# Patient Record
Sex: Female | Born: 1980 | Race: Black or African American | Hispanic: No | Marital: Married | Smoking: Former smoker
Health system: Southern US, Community
[De-identification: ages and names within clinical notes are randomized; demographics above are authoritative.]

## PROBLEM LIST (undated history)

## (undated) DIAGNOSIS — R2242 Localized swelling, mass and lump, left lower limb: Secondary | ICD-10-CM

---

## 2001-10-23 ENCOUNTER — Emergency Department (HOSPITAL_COMMUNITY): Admission: EM | Admit: 2001-10-23 | Discharge: 2001-10-24 | Payer: Self-pay | Admitting: Emergency Medicine

## 2001-10-26 ENCOUNTER — Ambulatory Visit (HOSPITAL_COMMUNITY): Admission: RE | Admit: 2001-10-26 | Discharge: 2001-10-26 | Payer: Self-pay | Admitting: Emergency Medicine

## 2001-10-26 ENCOUNTER — Encounter: Payer: Self-pay | Admitting: Emergency Medicine

## 2002-02-03 ENCOUNTER — Ambulatory Visit (HOSPITAL_COMMUNITY): Admission: RE | Admit: 2002-02-03 | Discharge: 2002-02-03 | Payer: Self-pay | Admitting: *Deleted

## 2002-02-03 ENCOUNTER — Encounter: Payer: Self-pay | Admitting: *Deleted

## 2002-02-09 ENCOUNTER — Emergency Department (HOSPITAL_COMMUNITY): Admission: EM | Admit: 2002-02-09 | Discharge: 2002-02-09 | Payer: Self-pay | Admitting: Emergency Medicine

## 2002-12-05 ENCOUNTER — Ambulatory Visit (HOSPITAL_COMMUNITY): Admission: RE | Admit: 2002-12-05 | Discharge: 2002-12-05 | Payer: Self-pay | Admitting: Gastroenterology

## 2003-01-16 ENCOUNTER — Emergency Department (HOSPITAL_COMMUNITY): Admission: EM | Admit: 2003-01-16 | Discharge: 2003-01-16 | Payer: Self-pay | Admitting: Emergency Medicine

## 2003-02-03 ENCOUNTER — Encounter (INDEPENDENT_AMBULATORY_CARE_PROVIDER_SITE_OTHER): Payer: Self-pay | Admitting: Specialist

## 2003-02-03 ENCOUNTER — Ambulatory Visit (HOSPITAL_COMMUNITY): Admission: RE | Admit: 2003-02-03 | Discharge: 2003-02-04 | Payer: Self-pay | Admitting: General Surgery

## 2003-02-03 HISTORY — PX: CHOLECYSTECTOMY: SHX55

## 2006-09-17 ENCOUNTER — Emergency Department (HOSPITAL_COMMUNITY): Admission: EM | Admit: 2006-09-17 | Discharge: 2006-09-17 | Payer: Self-pay | Admitting: Family Medicine

## 2007-10-05 ENCOUNTER — Emergency Department (HOSPITAL_COMMUNITY): Admission: EM | Admit: 2007-10-05 | Discharge: 2007-10-05 | Payer: Self-pay | Admitting: Emergency Medicine

## 2008-05-19 ENCOUNTER — Emergency Department (HOSPITAL_COMMUNITY): Admission: EM | Admit: 2008-05-19 | Discharge: 2008-05-19 | Payer: Self-pay | Admitting: Emergency Medicine

## 2008-08-07 ENCOUNTER — Ambulatory Visit (HOSPITAL_COMMUNITY): Admission: RE | Admit: 2008-08-07 | Discharge: 2008-08-07 | Payer: Self-pay | Admitting: Family Medicine

## 2009-01-30 ENCOUNTER — Emergency Department (HOSPITAL_COMMUNITY): Admission: EM | Admit: 2009-01-30 | Discharge: 2009-01-30 | Payer: Self-pay | Admitting: Family Medicine

## 2009-09-05 ENCOUNTER — Inpatient Hospital Stay (HOSPITAL_COMMUNITY): Admission: AD | Admit: 2009-09-05 | Discharge: 2009-09-05 | Payer: Self-pay | Admitting: Obstetrics and Gynecology

## 2009-09-26 ENCOUNTER — Inpatient Hospital Stay (HOSPITAL_COMMUNITY): Admission: AD | Admit: 2009-09-26 | Discharge: 2009-09-26 | Payer: Self-pay | Admitting: Obstetrics and Gynecology

## 2009-09-27 ENCOUNTER — Inpatient Hospital Stay (HOSPITAL_COMMUNITY): Admission: AD | Admit: 2009-09-27 | Discharge: 2009-09-27 | Payer: Self-pay | Admitting: Obstetrics and Gynecology

## 2009-09-27 ENCOUNTER — Other Ambulatory Visit: Payer: Self-pay | Admitting: Obstetrics and Gynecology

## 2009-09-28 ENCOUNTER — Inpatient Hospital Stay (HOSPITAL_COMMUNITY): Admission: AD | Admit: 2009-09-28 | Discharge: 2009-10-01 | Payer: Self-pay | Admitting: Obstetrics and Gynecology

## 2010-03-28 ENCOUNTER — Encounter: Payer: Self-pay | Admitting: Family Medicine

## 2010-05-21 LAB — CBC
HCT: 25.3 % — ABNORMAL LOW (ref 36.0–46.0)
Hemoglobin: 10.4 g/dL — ABNORMAL LOW (ref 12.0–15.0)
Hemoglobin: 8.8 g/dL — ABNORMAL LOW (ref 12.0–15.0)
MCH: 30.4 pg (ref 26.0–34.0)
MCH: 30.7 pg (ref 26.0–34.0)
MCHC: 34.6 g/dL (ref 30.0–36.0)
MCHC: 34.8 g/dL (ref 30.0–36.0)
MCV: 87.3 fL (ref 78.0–100.0)
RBC: 3.44 MIL/uL — ABNORMAL LOW (ref 3.87–5.11)
RDW: 13 % (ref 11.5–15.5)

## 2010-05-21 LAB — BASIC METABOLIC PANEL
CO2: 22 mEq/L (ref 19–32)
Chloride: 104 mEq/L (ref 96–112)
Creatinine, Ser: 0.46 mg/dL (ref 0.4–1.2)
GFR calc Af Amer: >60 mL/min (ref >60)
Glucose, Bld: 82 mg/dL (ref 70–99)
Sodium: 133 mEq/L — ABNORMAL LOW (ref 135–145)

## 2010-06-08 ENCOUNTER — Emergency Department (HOSPITAL_COMMUNITY)
Admission: EM | Admit: 2010-06-08 | Discharge: 2010-06-08 | Disposition: A | Discharge status: Home / Self Care | Payer: Medicaid Other | Attending: Emergency Medicine | Admitting: Emergency Medicine

## 2010-06-08 ENCOUNTER — Emergency Department (HOSPITAL_COMMUNITY): Payer: Medicaid Other

## 2010-06-08 DIAGNOSIS — I10 Essential (primary) hypertension: Secondary | ICD-10-CM | POA: Insufficient documentation

## 2010-06-08 DIAGNOSIS — S61209A Unspecified open wound of unspecified finger without damage to nail, initial encounter: Secondary | ICD-10-CM | POA: Insufficient documentation

## 2010-06-08 DIAGNOSIS — W2209XA Striking against other stationary object, initial encounter: Secondary | ICD-10-CM | POA: Insufficient documentation

## 2010-06-08 DIAGNOSIS — Y92009 Unspecified place in unspecified non-institutional (private) residence as the place of occurrence of the external cause: Secondary | ICD-10-CM | POA: Insufficient documentation

## 2010-06-08 LAB — POCT URINALYSIS DIP (DEVICE)
Nitrite: NEGATIVE
Protein, ur: NEGATIVE mg/dL
Urobilinogen, UA: 1 mg/dL (ref 0.0–1.0)
pH: 6.5 (ref 5.0–8.0)

## 2010-07-22 NOTE — Op Note (Signed)
   Christina Rich, Christina Rich                           ACCOUNT NO.:  [id number]   MEDICAL RECORD NO.:  [id number]                   PATIENT TYPE:  AMB   LOCATION:  ENDO                                 FACILITY:  [facility]   PHYSICIAN:  Anselmo Rod, M.D.               DATE OF BIRTH:  1980-12-21   DATE OF PROCEDURE:  12/05/2002  DATE OF DISCHARGE:                                 OPERATIVE REPORT   PROCEDURE:  Esophagogastroduodenoscopy.   ENDOSCOPIST:  Anselmo Rod, M.D.   INSTRUMENT USED:  Olympus video panendoscope.   INDICATIONS FOR PROCEDURE:  Epigastric  pain in a 30 year old African  American female, rule out peptic ulcer disease, esophagitis, gastritis, etc.   PREPROCEDURE PREPARATION:  Informed consent was obtained from the patient.  The patient was  fasted for 8 hours prior to the procedure.   PREPROCEDURE PHYSICAL:  The patient had stable vital signs, neck supple,  chest clear to auscultation, S1, S2 regular, abdomen soft with normal bowel  sounds.   DESCRIPTION OF PROCEDURE:  The patient was placed in the left lateral  decubitus position and sedated with 60 mg of Demerol, and 8 mg of Versed  intravenously. Once the patient was adequately sedated and maintained on low  flow oxygen and continuous cardiac monitoring, the Olympus video  panendoscope was advanced through the mouth piece over the tongue into the  esophagus under direct visualization.   The entire esophagus appeared normal with no evidence of rings, stricture,  masses, esophagitis or Barrett's mucosa. The scope was then advanced into  the stomach.  A small  hiatal hernia was seen on high retroflexion. The rest of the  gastric mucosa and proximal small bowel appeared normal.   IMPRESSION:  Normal esophagogastroduodenoscopy except for a small hiatal  hernia.   RECOMMENDATIONS:  1. Continue proton pump inhibitors.  2.     Avoid nonsteroidals.  3. Follow anti reflux measures.  4. Outpatient follow up in the  next  2 weeks or earlier if need be.                                               Anselmo Rod, M.D.    JNM/MEDQ  D:  12/06/2002  T:  12/06/2002  Job:  [id number]   cc:   Gabriel Earing, M.D.  [address]  [place]  [place] [address]  Fax: [phone]

## 2010-07-22 NOTE — Op Note (Signed)
Christina Rich, Christina Rich                           ACCOUNT NO.:  [id number]   MEDICAL RECORD NO.:  [id number]                   PATIENT TYPE:  EMS   LOCATION:  ED                                   FACILITY:  [facility]   PHYSICIAN:  Gabrielle Dare. Janee Morn, M.D.             DATE OF BIRTH:  [birth year]   DATE OF PROCEDURE:  02/03/2003  DATE OF DISCHARGE:  01/16/2003                                 OPERATIVE REPORT   PREOPERATIVE DIAGNOSIS:  Biliary dyskinesia.   POSTOPERATIVE DIAGNOSIS:  Biliary dyskinesia.   OPERATION PERFORMED:  Laparoscopic cholecystectomy with intraoperative  cholangiogram.   SURGEON:  Gabrielle Dare. Janee Morn, M.D.   ANESTHESIA:  General.   INDICATIONS FOR PROCEDURE:  The patient is a 30 year old female with a  history of recurrent right upper quadrant pain.  A work-up was consistent  with biliary dyskinesia and she presents for elective cholecystectomy.   DESCRIPTION OF PROCEDURE:  Informed consent was obtained.  The patient  received intravenous antibiotics.  She was brought to the operating room and  general anesthesia was administered.  Her abdomen was prepped and draped in  sterile fashion.  A curvilinear infraumbilical incision was made.  Subcutaneous tissues were dissected down revealing the anterior fascia which  was divided.  The peritoneal cavity was then entered under direct vision  without difficulty.  A 0 Vicryl pursestring suture was placed around the  fascial opening.  A Hasson trocar was inserted and the abdomen was  insufflated with carbon dioxide in standard fashion.  Under direct vision,  an 11 mm epigastric and two 5 mm lateral ports were placed.  The dome of the  gallbladder was retracted superomedially.  Some adhesions from the omentum  were gradually taken down off the gallbladder revealing the infundibulum  which was retracted inferolaterally.  Subsequently our dissection began  laterally and progressed medially.  The cystic artery was quite anterior.  This was dissected circumferentially and clipped twice proximally, once  distally and divided.  Subsequently, further dissection revealed  infundibulum and cystic duct.  Dissection continued until a nice large  window was made between the infundibulum and cystic duct and the liver.  Once this was accomplished with good visualization, a clip was placed on the  infundibulocystic duct junction.  A small nick was made in the cystic duct,  a cook catheter was inserted and an intraoperative cholangiogram was  obtained.  No filling defects were noted in the common bile duct and there  some of the cystic duct visible.  Subsequently, the [facility] catheter was  removed. The cystic duct was clipped three times proximally and divided.  The gallbladder was then taken off the liver bed with the Bovie cautery.  A  few areas of the liver bed were cauterized to get excellent hemostasis.  The  gallbladder was then removed intact from the umbilical port site.  The  abdomen was copiously irrigated.  The liver  bed was again checked and there  was no bleeding.  The irrigation fluid returned clear.  The pneumoperitoneum  was released after removing trocars under direct vision and the Hasson  trocar was removed.  The umbilical port site fascia was closed by tying the  0 Vicryl pursestring suture.  All four wounds were copiously irrigated.  0.25% Marcaine with epinephrine had been used in each for local anesthetic  and the skin over each was closed with a running 4-0 Vicryl  subcuticular stitch.  Sponge, needle and instrument counts were correct.  Benzoin and Steri-Strips and sterile dressings were applied.  The patient  tolerated the procedure without apparent complication and was taken to the  recovery room in stable condition.                                               Gabrielle Dare Janee Morn, M.D.    BET/MEDQ  D:  02/03/2003  T:  02/03/2003  Job:  [id number]

## 2010-07-22 NOTE — Discharge Summary (Signed)
Christina Rich, Christina Rich                           ACCOUNT NO.:  [id number]   MEDICAL RECORD NO.:  [id number]                   PATIENT TYPE:  OIB   LOCATION:  5734                                 FACILITY:  [facility]   PHYSICIAN:  Christina Dare. Janee Rich, M.D.             DATE OF BIRTH:  Apr 12, 1980   DATE OF ADMISSION:  02/03/2003  DATE OF DISCHARGE:  02/04/2003                                 DISCHARGE SUMMARY   DISCHARGE DIAGNOSES:  1. Biliary dyskinesia.  2. Laparoscopic cholecystectomy and intraoperative cholangiogram.   HISTORY OF PRESENT ILLNESS:  The patient is a 30 year old African-American  female who presented for elective cholecystectomy for biliary dyskinesia.   HOSPITAL COURSE:  The patient underwent an uncomplicated laparoscopic  cholecystectomy with intraoperative cholangiogram in the operating room.  Cholangiogram looked normal with no common duct __________.  Postoperatively  she tolerated gradual advancement of her diet.  She remained afebrile and  remained hemodynamically stable.  She had good pain relief.  She was  discharged home on postoperative day one on a regular diet.   DISCHARGE ACTIVITY:  No lifting.   DISCHARGE MEDICATIONS:  Percocet 1-2 p.o. q.6h p.r.n. pain.   FOLLOW UP:  Is in three weeks with Dr. Violeta Gelinas.                                                Christina Rich, M.D.    BET/MEDQ  D:  02/04/2003  T:  02/04/2003  Job:  [id number]

## 2010-12-02 LAB — CBC
MCHC: 34
MCV: 88.2
Platelets: 77 — ABNORMAL LOW
RBC: 4.74
RBC: 4.95
RDW: 13.7
WBC: 3.6 — ABNORMAL LOW

## 2010-12-02 LAB — DIFFERENTIAL
Lymphocytes Relative: 50 — ABNORMAL HIGH
Lymphs Abs: 1.8
Monocytes Relative: 11
Neutro Abs: 1.2 — ABNORMAL LOW
Neutrophils Relative %: 33 — ABNORMAL LOW

## 2010-12-02 LAB — BASIC METABOLIC PANEL
BUN: 5 — ABNORMAL LOW
CO2: 27
Calcium: 8.7
Chloride: 104
Chloride: 104
Creatinine, Ser: 0.68
Creatinine, Ser: 0.77
GFR calc Af Amer: >60
Glucose, Bld: 88

## 2011-08-27 ENCOUNTER — Encounter (HOSPITAL_COMMUNITY): Payer: Self-pay | Admitting: *Deleted

## 2011-08-27 ENCOUNTER — Emergency Department (HOSPITAL_COMMUNITY): Payer: Medicaid Other

## 2011-08-27 ENCOUNTER — Emergency Department (HOSPITAL_COMMUNITY)
Admission: EM | Admit: 2011-08-27 | Discharge: 2011-08-27 | Disposition: A | Discharge status: Home / Self Care | Payer: Medicaid Other | Attending: Emergency Medicine | Admitting: Emergency Medicine

## 2011-08-27 DIAGNOSIS — R0781 Pleurodynia: Secondary | ICD-10-CM

## 2011-08-27 DIAGNOSIS — R059 Cough, unspecified: Secondary | ICD-10-CM | POA: Insufficient documentation

## 2011-08-27 DIAGNOSIS — R071 Chest pain on breathing: Secondary | ICD-10-CM | POA: Insufficient documentation

## 2011-08-27 DIAGNOSIS — R05 Cough: Secondary | ICD-10-CM

## 2011-08-27 DIAGNOSIS — F17200 Nicotine dependence, unspecified, uncomplicated: Secondary | ICD-10-CM | POA: Insufficient documentation

## 2011-08-27 LAB — POCT I-STAT, CHEM 8
Chloride: 104 mEq/L (ref 96–112)
HCT: 43 % (ref 36.0–46.0)
Hemoglobin: 14.6 g/dL (ref 12.0–15.0)
Potassium: 3.6 mEq/L (ref 3.5–5.1)
Sodium: 143 mEq/L (ref 135–145)

## 2011-08-27 LAB — POCT I-STAT TROPONIN I

## 2011-08-27 MED ORDER — IBUPROFEN 400 MG PO TABS
600.0000 mg | ORAL_TABLET | Freq: Once | ORAL | Status: AC
  Administered 2011-08-27: 600 mg via ORAL
  Filled 2011-08-27: qty 1

## 2011-08-27 MED ORDER — HYDROCOD POLST-CHLORPHEN POLST 10-8 MG/5ML PO LQCR: 5.0000 mL | Freq: Every evening | ORAL | Status: DC | PRN

## 2011-08-27 MED ORDER — IBUPROFEN 600 MG PO TABS: 600.0000 mg | ORAL_TABLET | Freq: Four times a day (QID) | ORAL | Status: AC | PRN

## 2011-08-27 NOTE — ED Notes (Signed)
Pt reports mid chest pain that started yesterday, radiates into her back, having non productive cough. No acute distress noted at triage, ekg done.

## 2011-08-27 NOTE — ED Notes (Signed)
Pt here with cp onset yesterday sts worse with deep breath talking and standing up. sts sob and diaphoretic, nonproductive dry cough . Pt does report oca use and tobacco use.

## 2011-08-27 NOTE — ED Provider Notes (Signed)
History     CSN: [id number]  Arrival date & time 08/27/11  4098   First MD Initiated Contact with Patient 08/27/11 [phone]      Chief Complaint  Patient presents with  . Chest Pain     HPI Pt reports mid chest pain that started yesterday, radiates into her back, having non productive cough. No acute distress noted at triage, ekg done.  Patient did have upper respiratory infection approximately week ago with nonproductive cough.  Patient states the pain seems to be worse when she lies back and better when she sits up.  Patient has no previous history.  Patient denies history of blood clots or recent surgery.  Patient denies recent long trips or unilateral leg swelling.  History reviewed. No pertinent past medical history.  History reviewed. No pertinent past surgical history.  History reviewed. No pertinent family history.  History  Substance Use Topics  . Smoking status: Current Some Day Smoker    Types: Cigarettes  . Smokeless tobacco: Not on file  . Alcohol Use: Yes     occ    OB History    Grav Para Term Preterm Abortions TAB SAB Ect Mult Living                  Review of Systems  All other systems reviewed and are negative.    Allergies  Review of patient's allergies indicates no known allergies.  Home Medications   Current Outpatient Rx  Name Route Sig Dispense Refill  . IBUPROFEN 200 MG PO TABS Oral Take 400 mg by mouth every 6 (six) hours as needed. For pain    . HYDROCOD POLST-CPM POLST ER 10-8 MG/5ML PO LQCR Oral Take 5 mLs by mouth at bedtime as needed. 115 mL 0  . IBUPROFEN 600 MG PO TABS Oral Take 1 tablet (600 mg total) by mouth every 6 (six) hours as needed for pain. 30 tablet 0    BP 132/89  Pulse 60  Temp 98.4 F (36.9 C) (Oral)  Resp 18  SpO2 99%  Physical Exam  Nursing note and vitals reviewed. Constitutional: She is oriented to person, place, and time. She appears well-developed and well-nourished. No distress.  HENT:  Head:  Normocephalic and atraumatic.  Eyes: Pupils are equal, round, and reactive to light.  Neck: Normal range of motion.  Cardiovascular: Normal rate and intact distal pulses.         Date: 08/27/2011  Rate: 65  Rhythm: normal sinus rhythm  QRS Axis: normal  Intervals: normal  ST/T Wave abnormalities: normal  Conduction Disutrbances: none  Narrative Interpretation: unremarkable      Pulmonary/Chest: No respiratory distress.  Abdominal: Normal appearance. She exhibits no distension. There is no tenderness. There is no rebound.  Musculoskeletal: Normal range of motion.  Neurological: She is alert and oriented to person, place, and time. No cranial nerve deficit.  Skin: Skin is warm and dry. No rash noted.  Psychiatric: She has a normal mood and affect. Her behavior is normal.    ED Course  Procedures (including critical care time)  Labs Reviewed  D-DIMER, QUANTITATIVE - Abnormal; Notable for the following:    D-Dimer, Quant 0.54 (*)     All other components within normal limits  POCT I-STAT, CHEM 8  POCT I-STAT TROPONIN I   Dg Chest 2 View  08/27/2011  *RADIOLOGY REPORT*  Clinical Data: Chest pain  CHEST - 2 VIEW  Comparison: 05/19/2008  Findings: Normal heart size.  Clear lungs.  IMPRESSION: Negative.  Original Report Authenticated By: Donavan Burnet, M.D.     1. Pleuritic chest pain   2. Cough       MDM  Patient low pretest probability for pulmonary embolism.  Abnormal d-dimer unlikely to be secondary to DVT.  Plan at this time is to treat symptomatically hip precautions and have return if shortness of breath occurs or change in symptoms.        Nelia Shi, MD 08/27/11 [phone]

## 2011-08-27 NOTE — ED Notes (Signed)
Dr.Davidson to eval ecg at 7:05 Am.

## 2011-08-27 NOTE — ED Notes (Signed)
MD at bedside. 

## 2011-08-27 NOTE — Discharge Instructions (Signed)
Pleurisy  Pleurisy is an inflammation and swelling of the lining of the lungs. It usually is the result of an underlying infection or other disease. Because of this inflammation, it hurts to breathe. It is aggravated by coughing or deep breathing. The primary goal in treating pleurisy is to diagnose and treat the condition that caused it.   HOME CARE INSTRUCTIONS    Only take over-the-counter or prescription medicines for pain, discomfort, or fever as directed by your caregiver.   If medications which kill germs (antibiotics) were prescribed, take the entire course. Even if you are feeling better, you need to take them.   Use a cool mist vaporizer to help loosen secretions. This is so the secretions can be coughed up more easily.  SEEK MEDICAL CARE IF:    Your pain is not controlled with medication or is increasing.   You have an increase inpus like (purulent) secretions brought up with coughing.  SEEK IMMEDIATE MEDICAL CARE IF:    You have blue or dark lips, fingernails, or toenails.   You begin coughing up blood.   You have increased difficulty breathing.   You have continuing pain unrelieved by medicine or lasting more than 1 week.   You have pain that radiates into your neck, arms, or jaw.   You develop increased shortness of breath or wheezing.   You develop a fever, rash, vomiting, fainting, or other serious complaints.  Document Released: 02/20/2005 Document Revised: 02/09/2011 Document Reviewed: 09/21/2006  Exit[employer] Patient Information 2012 [employer].

## 2012-08-01 ENCOUNTER — Encounter (HOSPITAL_COMMUNITY): Payer: Self-pay | Admitting: *Deleted

## 2012-08-01 ENCOUNTER — Emergency Department (HOSPITAL_COMMUNITY)
Admission: EM | Admit: 2012-08-01 | Discharge: 2012-08-01 | Disposition: A | Discharge status: Home / Self Care | Payer: BC Managed Care – PPO | Attending: Emergency Medicine | Admitting: Emergency Medicine

## 2012-08-01 DIAGNOSIS — R112 Nausea with vomiting, unspecified: Secondary | ICD-10-CM | POA: Insufficient documentation

## 2012-08-01 DIAGNOSIS — F17200 Nicotine dependence, unspecified, uncomplicated: Secondary | ICD-10-CM | POA: Insufficient documentation

## 2012-08-01 DIAGNOSIS — R197 Diarrhea, unspecified: Secondary | ICD-10-CM | POA: Insufficient documentation

## 2012-08-01 DIAGNOSIS — R209 Unspecified disturbances of skin sensation: Secondary | ICD-10-CM | POA: Insufficient documentation

## 2012-08-01 DIAGNOSIS — Z3202 Encounter for pregnancy test, result negative: Secondary | ICD-10-CM | POA: Insufficient documentation

## 2012-08-01 DIAGNOSIS — R1013 Epigastric pain: Secondary | ICD-10-CM

## 2012-08-01 DIAGNOSIS — Z9089 Acquired absence of other organs: Secondary | ICD-10-CM | POA: Insufficient documentation

## 2012-08-01 DIAGNOSIS — Z975 Presence of (intrauterine) contraceptive device: Secondary | ICD-10-CM | POA: Insufficient documentation

## 2012-08-01 LAB — COMPREHENSIVE METABOLIC PANEL
ALT: 22 U/L (ref 0–35)
AST: 23 U/L (ref 0–37)
Alkaline Phosphatase: 55 U/L (ref 39–117)
CO2: 26 mEq/L (ref 19–32)
Calcium: 9.3 mg/dL (ref 8.4–10.5)
Chloride: 99 mEq/L (ref 96–112)
GFR calc non Af Amer: >90 mL/min (ref >90)
Potassium: 3.4 mEq/L — ABNORMAL LOW (ref 3.5–5.1)
Sodium: 136 mEq/L (ref 135–145)

## 2012-08-01 LAB — URINALYSIS, ROUTINE W REFLEX MICROSCOPIC
Glucose, UA: NEGATIVE mg/dL
Protein, ur: NEGATIVE mg/dL

## 2012-08-01 LAB — CBC WITH DIFFERENTIAL/PLATELET
Basophils Absolute: 0 10*3/uL (ref 0.0–0.1)
Lymphocytes Relative: 45 % (ref 12–46)
Lymphs Abs: 2 10*3/uL (ref 0.7–4.0)
Neutro Abs: 1.8 10*3/uL (ref 1.7–7.7)
Neutrophils Relative %: 41 % — ABNORMAL LOW (ref 43–77)
Platelets: 210 10*3/uL (ref 150–400)
RBC: 5.08 MIL/uL (ref 3.87–5.11)
RDW: 12.6 % (ref 11.5–15.5)
WBC: 4.5 10*3/uL (ref 4.0–10.5)

## 2012-08-01 LAB — POCT I-STAT TROPONIN I: Troponin i, poc: 0.01 ng/mL (ref 0.00–0.08)

## 2012-08-01 LAB — POCT PREGNANCY, URINE: Preg Test, Ur: NEGATIVE

## 2012-08-01 LAB — URINE MICROSCOPIC-ADD ON

## 2012-08-01 MED ORDER — SODIUM CHLORIDE 0.9 % IV SOLN
Freq: Once | INTRAVENOUS | Status: AC
  Administered 2012-08-01: 15:00:00 via INTRAVENOUS

## 2012-08-01 MED ORDER — ONDANSETRON HCL 4 MG/2ML IJ SOLN
4.0000 mg | Freq: Once | INTRAMUSCULAR | Status: AC
  Administered 2012-08-01: 4 mg via INTRAVENOUS
  Filled 2012-08-01: qty 2

## 2012-08-01 MED ORDER — SODIUM CHLORIDE 0.9 % IV BOLUS (SEPSIS)
1000.0000 mL | Freq: Once | INTRAVENOUS | Status: AC
  Administered 2012-08-01: 1000 mL via INTRAVENOUS

## 2012-08-01 MED ORDER — ONDANSETRON HCL 4 MG PO TABS: 4.0000 mg | ORAL_TABLET | Freq: Three times a day (TID) | ORAL | Status: DC | PRN

## 2012-08-01 MED ORDER — PANTOPRAZOLE SODIUM 40 MG IV SOLR
40.0000 mg | Freq: Once | INTRAVENOUS | Status: AC
  Administered 2012-08-01: 40 mg via INTRAVENOUS
  Filled 2012-08-01: qty 40

## 2012-08-01 MED ORDER — OMEPRAZOLE 20 MG PO CPDR: 20.0000 mg | DELAYED_RELEASE_CAPSULE | Freq: Every day | ORAL | Status: DC

## 2012-08-01 NOTE — ED Notes (Signed)
PA at bedside.

## 2012-08-01 NOTE — ED Provider Notes (Signed)
Date: 08/01/2012  Rate: 48  Rhythm: Sinus bradycardia  QRS Axis: normal  Intervals: normal  ST/T Wave abnormalities: normal  Conduction Disutrbances: none  Narrative Interpretation:   Old EKG Reviewed: No significant changes noted     Lyanne Co, MD 08/01/12 1457

## 2012-08-01 NOTE — ED Provider Notes (Signed)
History     CSN: [id number]  Arrival date & time 08/01/12  1218   First MD Initiated Contact with Patient 08/01/12 1243      Chief Complaint  Patient presents with  . Abdominal Pain  . Diarrhea  . Nausea    (Consider location/radiation/quality/duration/timing/severity/associated sxs/prior treatment) HPI Comments: 32 y.o. Female presents with hx of cholecystectomy today complaining of epigastric pain, nausea, diarrhea, and vomiting that began about 3 days ago, gradually worsening. Pt describes pain as stabbing, worse after she eats, intermittent, and feels better if she rubs her stomach and drinks ginger ale. Pt denies fever, dysuria, hematuria,   Pt admits occasional pain in left arm and left leg that seems to "pinch" when she is feeling this epigastric pain.   LMP - pt has a Mirena implant that causes occasional spotting. She sees the [facility] for assistance and re-checks.   Patient is a 32 y.o. female presenting with abdominal pain and diarrhea.  Abdominal Pain This is a new problem. Episode onset: 3 days ago. The problem occurs constantly. The problem has been gradually worsening. Associated symptoms include abdominal pain, nausea and vomiting. Pertinent negatives include no chest pain, diaphoresis, fever, headaches, neck pain, numbness, rash or weakness. The symptoms are aggravated by eating. Treatments tried: warm compresses, rubbing her belly, ginger ale. The treatment provided mild relief.  Diarrhea Associated symptoms: abdominal pain and vomiting   Associated symptoms: no diaphoresis, no fever and no headaches     Past Medical History  Diagnosis Date  . Abdominal pain     Past Surgical History  Procedure Laterality Date  . Cholecystectomy    . Cesarean section      x2  . Intrauterine device (iud) insertion      History reviewed. No pertinent family history.  History  Substance Use Topics  . Smoking status: Current Some Day Smoker    Types: Cigarettes  .  Smokeless tobacco: Never Used  . Alcohol Use: Yes     Comment: occ    OB History   Grav Para Term Preterm Abortions TAB SAB Ect Mult Living                  Review of Systems  Constitutional: Negative for fever and diaphoresis.  HENT: Negative for neck pain and neck stiffness.   Eyes: Negative for visual disturbance.  Respiratory: Negative for apnea, chest tightness and shortness of breath.   Cardiovascular: Negative for chest pain and palpitations.  Gastrointestinal: Positive for nausea, vomiting, abdominal pain and diarrhea. Negative for constipation.       Epigastric  Genitourinary: Negative for dysuria and flank pain.  Musculoskeletal: Negative for gait problem.  Skin: Negative for rash.  Neurological: Negative for dizziness, syncope, weakness, light-headedness, numbness and headaches.    Allergies  Review of patient's allergies indicates no known allergies.  Home Medications   Current Outpatient Rx  Name  Route  Sig  Dispense  Refill  . ibuprofen (ADVIL,MOTRIN) 200 MG tablet   Oral   Take 400 mg by mouth every 6 (six) hours as needed. For pain         . chlorpheniramine-HYDROcodone (TUSSIONEX PENNKINETIC ER) 10-8 MG/5ML LQCR   Oral   Take 5 mLs by mouth at bedtime as needed.   115 mL   0     BP 134/87  Pulse 85  Temp(Src) 98 F (36.7 C) (Oral)  Resp 18  Ht 5\' 4"  (1.626 m)  Wt 165 lb (74.844 kg)  BMI 28.31 kg/m2  SpO2 100%  Physical Exam  Nursing note and vitals reviewed. Constitutional: She is oriented to person, place, and time. She appears well-developed and well-nourished. No distress.  HENT:  Head: Normocephalic and atraumatic.  Eyes: Conjunctivae and EOM are normal.  Neck: Normal range of motion. Neck supple.  No meningeal signs  Cardiovascular: Normal rate, regular rhythm and normal heart sounds.  Exam reveals no gallop and no friction rub.   No murmur heard. Pulmonary/Chest: Effort normal and breath sounds normal. No respiratory distress.  She has no wheezes. She has no rales. She exhibits no tenderness.  Abdominal: Soft. Bowel sounds are normal. She exhibits no distension. There is tenderness. There is no rebound and no guarding.  Epigastric pain. No pain at McBurney's point. No Rovsing's sign  Musculoskeletal: Normal range of motion. She exhibits no edema and no tenderness.  Neurological: She is alert and oriented to person, place, and time. No cranial nerve deficit.  Skin: Skin is warm and dry. She is not diaphoretic. No erythema.  Psychiatric: She has a normal mood and affect.    ED Course  Procedures (including critical care time)  Labs Reviewed  CBC WITH DIFFERENTIAL - Abnormal; Notable for the following:    Hemoglobin 15.3 (*)    Neutrophils Relative % 41 (*)    All other components within normal limits  COMPREHENSIVE METABOLIC PANEL - Abnormal; Notable for the following:    Potassium 3.4 (*)    Total Bilirubin 1.4 (*)    All other components within normal limits  URINALYSIS, ROUTINE W REFLEX MICROSCOPIC - Abnormal; Notable for the following:    Color, Urine AMBER (*)    Specific Gravity, Urine 1.031 (*)    Hgb urine dipstick TRACE (*)    Bilirubin Urine SMALL (*)    All other components within normal limits  LIPASE, BLOOD  URINE MICROSCOPIC-ADD ON  POCT PREGNANCY, URINE  POCT I-STAT TROPONIN I   No results found. Date: 08/01/2012  Rate: 48  Rhythm: Sinus bradycardia  QRS Axis: normal  Intervals: normal  ST/T Wave abnormalities: normal  Conduction Disutrbances: none  Narrative Interpretation:  Old EKG Reviewed: No significant changes noted   1. Epigastric abdominal pain   2. Nausea & vomiting       MDM  Patient is afebrile, nontoxic, nonseptic appearing, in no apparent distress.  On physical exam patient does not appear to have a surgical abdomen and there are no peritoneal signs.  No indication of appendicitis, bowel obstruction, bowel perforation, cholecystitis, diverticulitis, PID or ectopic  pregnancy.    2:47 PM  On re-assessment, pt feeling better after zofran, protonix, and a liter of fluids. Pt states that she does feel like she is ready to go home at this point.   Patient's pain and other symptoms adequately managed in emergency department.  Fluid bolus given.  Labs, imaging and vitals reviewed. Patient discharged home with symptomatic treatment and given strict instructions for follow-up with their primary care physician.  I have also discussed reasons to return immediately to the ER.  Patient expresses understanding and agrees with plan. Discussed discharge plan with Dr. Patria Mane who is in agreement as well.   Glade Nurse, PA-C 08/01/12 2027

## 2012-08-01 NOTE — ED Notes (Signed)
Pt from home with reports of epigastric pain, nausea, diarrhea and 1 episode of emesis on Tuesday. Pt reports that pain is intermittent with radiation to left leg and left arm and is worse at night . Pt reports symptoms for 3 days.

## 2012-08-02 NOTE — ED Provider Notes (Signed)
Medical screening examination/treatment/procedure(s) were performed by non-physician practitioner and as supervising physician I was immediately available for consultation/collaboration.  Lyanne Co, MD 08/02/12 [phone]

## 2012-10-01 ENCOUNTER — Emergency Department (INDEPENDENT_AMBULATORY_CARE_PROVIDER_SITE_OTHER): Payer: BC Managed Care – PPO

## 2012-10-01 ENCOUNTER — Encounter (HOSPITAL_COMMUNITY): Payer: Self-pay | Admitting: Emergency Medicine

## 2012-10-01 ENCOUNTER — Emergency Department (INDEPENDENT_AMBULATORY_CARE_PROVIDER_SITE_OTHER)
Admission: EM | Admit: 2012-10-01 | Discharge: 2012-10-01 | Disposition: A | Discharge status: Home / Self Care | Payer: BC Managed Care – PPO | Source: Home / Self Care | Attending: Emergency Medicine | Admitting: Emergency Medicine

## 2012-10-01 ENCOUNTER — Emergency Department (HOSPITAL_COMMUNITY): Payer: BC Managed Care – PPO

## 2012-10-01 DIAGNOSIS — IMO0002 Reserved for concepts with insufficient information to code with codable children: Secondary | ICD-10-CM

## 2012-10-01 DIAGNOSIS — S46912A Strain of unspecified muscle, fascia and tendon at shoulder and upper arm level, left arm, initial encounter: Secondary | ICD-10-CM

## 2012-10-01 MED ORDER — NAPROXEN 500 MG PO TABS: 500.0000 mg | ORAL_TABLET | Freq: Two times a day (BID) | ORAL | Status: DC

## 2012-10-01 MED ORDER — HYDROCODONE-ACETAMINOPHEN 5-325 MG PO TABS: 1.0000 | ORAL_TABLET | Freq: Four times a day (QID) | ORAL | Status: DC | PRN
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
## 2012-10-01 NOTE — ED Provider Notes (Signed)
Medical screening examination/treatment/procedure(s) were performed by non-physician practitioner and as supervising physician I was immediately available for consultation/collaboration.  Raynald Blend, MD 10/01/12 1309

## 2012-10-03 ENCOUNTER — Encounter (HOSPITAL_COMMUNITY): Payer: Self-pay | Admitting: Emergency Medicine

## 2012-10-03 ENCOUNTER — Emergency Department (INDEPENDENT_AMBULATORY_CARE_PROVIDER_SITE_OTHER)
Admission: EM | Admit: 2012-10-03 | Discharge: 2012-10-03 | Disposition: A | Discharge status: Home / Self Care | Payer: BC Managed Care – PPO | Source: Home / Self Care

## 2012-10-03 DIAGNOSIS — IMO0001 Reserved for inherently not codable concepts without codable children: Secondary | ICD-10-CM

## 2012-10-03 DIAGNOSIS — M25512 Pain in left shoulder: Secondary | ICD-10-CM

## 2012-10-03 DIAGNOSIS — M25519 Pain in unspecified shoulder: Secondary | ICD-10-CM

## 2012-10-03 MED ORDER — CYCLOBENZAPRINE HCL 10 MG PO TABS: 5.0000 mg | ORAL_TABLET | Freq: Two times a day (BID) | ORAL | Status: DC | PRN

## 2012-10-03 MED ORDER — DICLOFENAC SODIUM 1 % TD GEL: 2.0000 g | Freq: Four times a day (QID) | TRANSDERMAL | Status: DC

## 2012-10-03 NOTE — ED Notes (Signed)
Pt c/o persistent left shoulder... Seen here on 10/01/12 and dx w/shoulder strain... Given Norco 5/325 and Naproxen 500 w/temp relief... Wants to know if we can prescribe a muscle relaxer... Also reports numbness and tingly of left arm onset Tuesday... Has appt w/[facility] and [facility]er on Monday... Alert w/no signs of acute distress.

## 2012-10-03 NOTE — ED Provider Notes (Signed)
Medical screening examination/treatment/procedure(s) were performed by non-physician practitioner and as supervising physician I was immediately available for consultation/collaboration.  Leslee Home, M.D.  Reuben Likes, MD 10/03/12 [phone]
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
## 2012-10-23 ENCOUNTER — Encounter (HOSPITAL_COMMUNITY): Payer: Self-pay | Admitting: *Deleted

## 2012-10-23 ENCOUNTER — Emergency Department (HOSPITAL_COMMUNITY)
Admission: EM | Admit: 2012-10-23 | Discharge: 2012-10-23 | Discharge status: Against Medical Advice | Payer: BC Managed Care – PPO | Attending: Emergency Medicine | Admitting: Emergency Medicine

## 2012-10-23 DIAGNOSIS — R51 Headache: Secondary | ICD-10-CM | POA: Insufficient documentation

## 2012-10-23 DIAGNOSIS — Z791 Long term (current) use of non-steroidal anti-inflammatories (NSAID): Secondary | ICD-10-CM | POA: Insufficient documentation

## 2012-10-23 DIAGNOSIS — F17200 Nicotine dependence, unspecified, uncomplicated: Secondary | ICD-10-CM | POA: Insufficient documentation

## 2012-10-23 DIAGNOSIS — Z79899 Other long term (current) drug therapy: Secondary | ICD-10-CM | POA: Insufficient documentation

## 2012-10-23 DIAGNOSIS — R42 Dizziness and giddiness: Secondary | ICD-10-CM | POA: Insufficient documentation

## 2012-10-23 DIAGNOSIS — R109 Unspecified abdominal pain: Secondary | ICD-10-CM | POA: Insufficient documentation

## 2012-10-23 MED ORDER — METOCLOPRAMIDE HCL 5 MG/ML IJ SOLN: 10.0000 mg | Freq: Once | INTRAMUSCULAR | Status: DC

## 2012-10-23 MED ORDER — SODIUM CHLORIDE 0.9 % IV BOLUS (SEPSIS): 1000.0000 mL | Freq: Once | INTRAVENOUS | Status: DC

## 2012-10-23 MED ORDER — KETOROLAC TROMETHAMINE 30 MG/ML IJ SOLN: 30.0000 mg | Freq: Once | INTRAMUSCULAR | Status: DC

## 2012-10-23 MED ORDER — DIPHENHYDRAMINE HCL 50 MG/ML IJ SOLN: 25.0000 mg | Freq: Once | INTRAMUSCULAR | Status: DC

## 2012-10-23 NOTE — ED Notes (Signed)
Patient states she had a headache this morning. Around noon patient felt lightheaded. No loss of consciousness. Patient went to see nurse at work and was told her BP was very high. 150/110. RN was concerned and sent patient to ED. BP now 128/84. Patient denies lightheadedness but still c/o headache.

## 2012-10-23 NOTE — ED Provider Notes (Signed)
CSN: [id number]     Arrival date & time 10/23/12  1329 History     First MD Initiated Contact with Patient 10/23/12 1515     Chief Complaint  Patient presents with  . Headache   (Consider location/radiation/quality/duration/timing/severity/associated sxs/prior Treatment) The history is provided by the patient and medical records. No language interpreter was used.    Christina Rich is a 32 y.o. female  with a hx of stomach problems (unspecified) presents to the Emergency Department complaining of gradual, persistent, progressively worsening L sided headache beginning at 7am.  Pt took tylenol at 8am which did not help.  Pt then had associated lightheaded (where she had to put her head on her desk) beginning about 11am.  Pt went to the RN at her job who took her BP and found it to be 150/110.  She was instructed by the RN to come immediately to the ER.  Pt states the pain is located on the L side of her head, beginning behind her eye and going all the way around the back; described as throbbing, rated 6/10.  Pt has not attempted to take any further medication for the pain.  Laying down and turning the lights out makes it better and light and sound makes it worse. Pt reports resolution of her lightheadedness. Fhx of migraines, but no personal Hx.  Pt states she knows her BP is high because her head hurts, but was only on meds during her pregnancy.  Pt denies fever, chills, neck pain/stiffness, chest pain, SOB, abd pain, N/V/D weakness, dizziness, syncope, dysuria, hematuria, vision changes.      Past Medical History  Diagnosis Date  . Abdominal pain    Past Surgical History  Procedure Laterality Date  . Cholecystectomy    . Cesarean section      x2  . Intrauterine device (iud) insertion     History reviewed. No pertinent family history. History  Substance Use Topics  . Smoking status: Current Some Day Smoker    Types: Cigarettes  . Smokeless tobacco: Never Used  . Alcohol Use: Yes      Comment: occ   OB History   Grav Para Term Preterm Abortions TAB SAB Ect Mult Living                 Review of Systems  Constitutional: Negative for fever, diaphoresis, appetite change, fatigue and unexpected weight change.  HENT: Negative for mouth sores and neck stiffness.   Eyes: Negative for visual disturbance.  Respiratory: Negative for cough, chest tightness, shortness of breath and wheezing.   Cardiovascular: Negative for chest pain.  Gastrointestinal: Negative for nausea, vomiting, abdominal pain, diarrhea and constipation.  Endocrine: Negative for polydipsia, polyphagia and polyuria.  Genitourinary: Negative for dysuria, urgency, frequency and hematuria.  Musculoskeletal: Negative for back pain.  Skin: Negative for rash.  Allergic/Immunologic: Negative for immunocompromised state.  Neurological: Positive for light-headedness and headaches. Negative for syncope.  Hematological: Does not bruise/bleed easily.  Psychiatric/Behavioral: Negative for sleep disturbance. The patient is not nervous/anxious.     Allergies  Review of patient's allergies indicates no known allergies.  Home Medications   Current Outpatient Rx  Name  Route  Sig  Dispense  Refill  . acetaminophen (TYLENOL) 500 MG tablet   Oral   Take 1,000 mg by mouth every 6 (six) hours as needed for pain.         . cyclobenzaprine (FLEXERIL) 10 MG tablet   Oral   Take 0.5 tablets (5 mg  total) by mouth 2 (two) times daily as needed for muscle spasms.   20 tablet   0   . HYDROcodone-acetaminophen (NORCO) 5-325 MG per tablet   Oral   Take 1 tablet by mouth every 6 (six) hours as needed for pain.   15 tablet   0   . ibuprofen (ADVIL,MOTRIN) 200 MG tablet   Oral   Take 400 mg by mouth every 6 (six) hours as needed. For pain         . levonorgestrel (MIRENA) 20 MCG/24HR IUD   Intrauterine   1 each by Intrauterine route once.         . naproxen (NAPROSYN) 500 MG tablet   Oral   Take 1 tablet  (500 mg total) by mouth 2 (two) times daily.   60 tablet   0    BP 128/84  Pulse 54  Temp(Src) 98.8 F (37.1 C) (Oral)  SpO2 100% Physical Exam  Nursing note and vitals reviewed. Constitutional: She is oriented to person, place, and time. She appears well-developed and well-nourished. No distress.  HENT:  Head: Normocephalic and atraumatic.  Mouth/Throat: Oropharynx is clear and moist.  Eyes: Conjunctivae and EOM are normal. Pupils are equal, round, and reactive to light. No scleral icterus.  Neck: Normal range of motion. Neck supple.  Cardiovascular: Normal rate, regular rhythm, normal heart sounds and intact distal pulses.   No murmur heard. Pulmonary/Chest: Effort normal and breath sounds normal. No respiratory distress. She has no wheezes. She has no rales.  Abdominal: Soft. Bowel sounds are normal. There is no tenderness. There is no rebound and no guarding.  Musculoskeletal: Normal range of motion.  Lymphadenopathy:    She has no cervical adenopathy.  Neurological: She is alert and oriented to person, place, and time. She has normal reflexes. No cranial nerve deficit. She exhibits normal muscle tone. Coordination normal.  Speech is clear and goal oriented, follows commands Cranial nerves III - XII without deficit, no facial droop Normal strength in upper and lower extremities bilaterally, strong and equal grip strength Sensation normal to light and sharp touch Moves extremities without ataxia, coordination intact Normal finger to nose and rapid alternating movements Neg romberg, no pronator drift Normal gait Normal heel-shin and balance  Skin: Skin is warm and dry. No rash noted. She is not diaphoretic.  Psychiatric: She has a normal mood and affect. Her behavior is normal. Judgment and thought content normal.    ED Course   Procedures (including critical care time)  Labs Reviewed - No data to display No results found. 1. Headache     MDM  Juanell Ekpene-Cook  presents with HA, lightheadedness (now resolved) and c/o HTN.  BP 128/84 at triage.  Pt without neurologic deficit on exam.  Will give migraine cocktail and re-evaluate.  Pt without neurologic deficits and I do not believe a CT is warranted at this time.    BP 128/84  Pulse 54  Temp(Src) 98.8 F (37.1 C) (Oral)  SpO2 100%   4:54 PM Pt eloped from the ED AMA without notifying anyone and before receiving any treatments.      Dahlia Client Takuma Cifelli, PA-C 10/23/12 1655
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
## 2012-10-23 NOTE — ED Provider Notes (Signed)
Medical screening examination/treatment/procedure(s) were performed by non-physician practitioner and as supervising physician I was immediately available for consultation/collaboration.  Adahlia Stembridge N Cristoval Teall, DO 10/23/12 2231 

## 2012-11-25 ENCOUNTER — Ambulatory Visit: Payer: BC Managed Care – PPO | Admitting: Licensed Clinical Social Worker

## 2012-11-27 ENCOUNTER — Ambulatory Visit: Payer: BC Managed Care – PPO | Admitting: Licensed Clinical Social Worker

## 2012-12-11 ENCOUNTER — Ambulatory Visit: Payer: BC Managed Care – PPO | Admitting: Licensed Clinical Social Worker

## 2013-01-01 ENCOUNTER — Ambulatory Visit (INDEPENDENT_AMBULATORY_CARE_PROVIDER_SITE_OTHER): Payer: BC Managed Care – PPO | Admitting: Licensed Clinical Social Worker

## 2013-01-01 DIAGNOSIS — F321 Major depressive disorder, single episode, moderate: Secondary | ICD-10-CM

## 2013-01-15 ENCOUNTER — Ambulatory Visit (INDEPENDENT_AMBULATORY_CARE_PROVIDER_SITE_OTHER): Payer: BC Managed Care – PPO | Admitting: Licensed Clinical Social Worker

## 2013-01-15 DIAGNOSIS — F321 Major depressive disorder, single episode, moderate: Secondary | ICD-10-CM

## 2013-01-27 ENCOUNTER — Encounter (HOSPITAL_COMMUNITY): Payer: Self-pay | Admitting: Emergency Medicine

## 2013-01-27 ENCOUNTER — Emergency Department (INDEPENDENT_AMBULATORY_CARE_PROVIDER_SITE_OTHER)
Admission: EM | Admit: 2013-01-27 | Discharge: 2013-01-27 | Disposition: A | Discharge status: Home / Self Care | Payer: BC Managed Care – PPO | Source: Home / Self Care | Attending: Family Medicine | Admitting: Family Medicine

## 2013-01-27 ENCOUNTER — Emergency Department (INDEPENDENT_AMBULATORY_CARE_PROVIDER_SITE_OTHER): Payer: BC Managed Care – PPO

## 2013-01-27 DIAGNOSIS — S63639A Sprain of interphalangeal joint of unspecified finger, initial encounter: Secondary | ICD-10-CM

## 2013-01-27 NOTE — ED Provider Notes (Signed)
Christina Rich is a 32 y.o. female who presents to Urgent Care today for right fourth digit injury. Patient jammed her finger against a door yesterday. She notes pain and swelling at the right fourth PIP.  She notes pain with flexion. She denies any numbness. She has tried ice but no other treatments yet. The pain is moderate and worse with activity.   Past Medical History  Diagnosis Date  . Abdominal pain    History  Substance Use Topics  . Smoking status: Current Some Day Smoker    Types: Cigarettes  . Smokeless tobacco: Never Used  . Alcohol Use: Yes     Comment: occ   ROS as above Medications reviewed. No current facility-administered medications for this encounter.   Current Outpatient Prescriptions  Medication Sig Dispense Refill  . acetaminophen (TYLENOL) 500 MG tablet Take 1,000 mg by mouth every 6 (six) hours as needed for pain.      . cyclobenzaprine (FLEXERIL) 10 MG tablet Take 0.5 tablets (5 mg total) by mouth 2 (two) times daily as needed for muscle spasms.  20 tablet  0  . HYDROcodone-acetaminophen (NORCO) 5-325 MG per tablet Take 1 tablet by mouth every 6 (six) hours as needed for pain.  15 tablet  0  . ibuprofen (ADVIL,MOTRIN) 200 MG tablet Take 400 mg by mouth every 6 (six) hours as needed. For pain      . levonorgestrel (MIRENA) 20 MCG/24HR IUD 1 each by Intrauterine route once.      . naproxen (NAPROSYN) 500 MG tablet Take 1 tablet (500 mg total) by mouth 2 (two) times daily.  60 tablet  0    Exam:  BP 133/88  Pulse 62  Temp(Src) 98.5 F (36.9 C) (Oral)  Resp 20  SpO2 100% Gen: Well NAD RIGHT HAND: Swollen tender right fourth PIP. Pain along the volar aspect of the joint.  Sensation capillary refill are intact distally. Decreased motion and pain with flexion/.  No results found for this or any previous visit (from the past 24 hour(s)). Dg Finger Ring Right  01/27/2013   CLINICAL DATA:  Pain, swelling, injury  EXAM: RIGHT RING FINGER 2+V  COMPARISON:   06/08/2010  FINDINGS: Mild soft tissue swelling. Acute nondisplaced volar plate fracture of the right 4th finger middle phalanx at the PIP joint. Mild associated soft tissue swelling. Normal alignment. No subluxation dislocation. No arthropathy or degenerative process.  IMPRESSION: Right 4th digit middle phalanx volar plate fracture at the PIP joint.   Electronically Signed   By: Ruel Favors M.D.   On: 01/27/2013 09:58    Assessment and Plan: 32 y.o. female with right fourth PIP volar plate injury. Slightly displaced.  Plan to treat with dorsal splint with mild flexion and followup with hand surgery for further evaluation and management.  Discussed warning signs or symptoms. Please see discharge instructions. Patient expresses understanding.      Rodolph Bong, MD 01/27/13 [phone]

## 2013-01-27 NOTE — ED Notes (Signed)
Right ring finger injury, jammed finger yesterday

## 2013-02-05 ENCOUNTER — Ambulatory Visit: Payer: BC Managed Care – PPO | Admitting: Licensed Clinical Social Worker

## 2013-02-14 ENCOUNTER — Ambulatory Visit: Payer: BC Managed Care – PPO | Admitting: Licensed Clinical Social Worker

## 2013-03-07 ENCOUNTER — Ambulatory Visit: Payer: BC Managed Care – PPO | Admitting: Licensed Clinical Social Worker

## 2014-02-28 ENCOUNTER — Encounter (HOSPITAL_COMMUNITY): Payer: Self-pay | Admitting: *Deleted

## 2014-02-28 ENCOUNTER — Emergency Department (INDEPENDENT_AMBULATORY_CARE_PROVIDER_SITE_OTHER)
Admission: EM | Admit: 2014-02-28 | Discharge: 2014-02-28 | Disposition: A | Discharge status: Home / Self Care | Payer: BC Managed Care – PPO | Source: Home / Self Care | Attending: Family Medicine | Admitting: Family Medicine

## 2014-02-28 DIAGNOSIS — M545 Low back pain, unspecified: Secondary | ICD-10-CM

## 2014-02-28 LAB — POCT URINALYSIS DIP (DEVICE)
BILIRUBIN URINE: NEGATIVE
GLUCOSE, UA: NEGATIVE mg/dL
Ketones, ur: NEGATIVE mg/dL
Leukocytes, UA: NEGATIVE
Nitrite: NEGATIVE
Protein, ur: NEGATIVE mg/dL
SPECIFIC GRAVITY, URINE: 1.025 (ref 1.005–1.030)
UROBILINOGEN UA: 1 mg/dL (ref 0.0–1.0)
pH: 7 (ref 5.0–8.0)

## 2014-02-28 LAB — POCT PREGNANCY, URINE: PREG TEST UR: NEGATIVE

## 2014-02-28 MED ORDER — METHYLPREDNISOLONE ACETATE PF 80 MG/ML IJ SUSP
80.0000 mg | Freq: Once | INTRAMUSCULAR | Status: AC
  Administered 2014-02-28: 80 mg via INTRAMUSCULAR

## 2014-02-28 MED ORDER — HYDROCODONE-ACETAMINOPHEN 5-325 MG PO TABS
ORAL_TABLET | ORAL | Status: AC
  Filled 2014-02-28: qty 1

## 2014-02-28 MED ORDER — HYDROCODONE-ACETAMINOPHEN 5-325 MG PO TABS: 1.0000 | ORAL_TABLET | Freq: Four times a day (QID) | ORAL | Status: DC | PRN

## 2014-02-28 MED ORDER — DICLOFENAC SODIUM 50 MG PO TBEC: 50.0000 mg | DELAYED_RELEASE_TABLET | Freq: Two times a day (BID) | ORAL | Status: DC | PRN

## 2014-02-28 MED ORDER — HYDROCODONE-ACETAMINOPHEN 5-325 MG PO TABS
1.0000 | ORAL_TABLET | Freq: Once | ORAL | Status: AC
  Administered 2014-02-28: 1 via ORAL

## 2014-02-28 MED ORDER — CYCLOBENZAPRINE HCL 5 MG PO TABS: 5.0000 mg | ORAL_TABLET | Freq: Every evening | ORAL | Status: DC | PRN

## 2014-02-28 MED ORDER — KETOROLAC TROMETHAMINE 60 MG/2ML IM SOLN
INTRAMUSCULAR | Status: AC
  Filled 2014-02-28: qty 2

## 2014-02-28 MED ORDER — KETOROLAC TROMETHAMINE 60 MG/2ML IM SOLN
60.0000 mg | Freq: Once | INTRAMUSCULAR | Status: AC
  Administered 2014-02-28: 60 mg via INTRAMUSCULAR

## 2014-02-28 MED ORDER — METHYLPREDNISOLONE ACETATE 80 MG/ML IJ SUSP
INTRAMUSCULAR | Status: AC
  Filled 2014-02-28: qty 1

## 2014-02-28 NOTE — ED Notes (Signed)
C/O low back pain intermittently x "months".  Today, pain significantly worse and radiating into bilat thighs.  Denies numbness or weakness.

## 2014-02-28 NOTE — Discharge Instructions (Signed)
Thank you for coming in today. Follow up with Dr. Tamala Julian.  Come back or go to the emergency room if you notice new weakness new numbness problems walking or bowel or bladder problems. Do not drive after taking norco or flexeril.   Back Exercises These exercises may help you when beginning to rehabilitate your injury. Your symptoms may resolve with or without further involvement from your physician, physical therapist or athletic trainer. While completing these exercises, remember:   Restoring tissue flexibility helps normal motion to return to the joints. This allows healthier, less painful movement and activity.  An effective stretch should be held for at least 30 seconds.  A stretch should never be painful. You should only feel a gentle lengthening or release in the stretched tissue. STRETCH - Extension, Prone on Elbows   Lie on your stomach on the floor, a bed will be too soft. Place your palms about shoulder width apart and at the height of your head.  Place your elbows under your shoulders. If this is too painful, stack pillows under your chest.  Allow your body to relax so that your hips drop lower and make contact more completely with the floor.  Hold this position for __________ seconds.  Slowly return to lying flat on the floor. Repeat __________ times. Complete this exercise __________ times per day.  RANGE OF MOTION - Extension, Prone Press Ups   Lie on your stomach on the floor, a bed will be too soft. Place your palms about shoulder width apart and at the height of your head.  Keeping your back as relaxed as possible, slowly straighten your elbows while keeping your hips on the floor. You may adjust the placement of your hands to maximize your comfort. As you gain motion, your hands will come more underneath your shoulders.  Hold this position __________ seconds.  Slowly return to lying flat on the floor. Repeat __________ times. Complete this exercise __________ times  per day.  RANGE OF MOTION- Quadruped, Neutral Spine   Assume a hands and knees position on a firm surface. Keep your hands under your shoulders and your knees under your hips. You may place padding under your knees for comfort.  Drop your head and point your tail bone toward the ground below you. This will round out your low back like an angry cat. Hold this position for __________ seconds.  Slowly lift your head and release your tail bone so that your back sags into a large arch, like an old horse.  Hold this position for __________ seconds.  Repeat this until you feel limber in your low back.  Now, find your "sweet spot." This will be the most comfortable position somewhere between the two previous positions. This is your neutral spine. Once you have found this position, tense your stomach muscles to support your low back.  Hold this position for __________ seconds. Repeat __________ times. Complete this exercise __________ times per day.  STRETCH - Flexion, Single Knee to Chest   Lie on a firm bed or floor with both legs extended in front of you.  Keeping one leg in contact with the floor, bring your opposite knee to your chest. Hold your leg in place by either grabbing behind your thigh or at your knee.  Pull until you feel a gentle stretch in your low back. Hold __________ seconds.  Slowly release your grasp and repeat the exercise with the opposite side. Repeat __________ times. Complete this exercise __________ times per day.  STRETCH - Hamstrings, Standing  Stand or sit and extend your right / left leg, placing your foot on a chair or foot stool  Keeping a slight arch in your low back and your hips straight forward.  Lead with your chest and lean forward at the waist until you feel a gentle stretch in the back of your right / left knee or thigh. (When done correctly, this exercise requires leaning only a small distance.)  Hold this position for __________ seconds. Repeat  __________ times. Complete this stretch __________ times per day. STRENGTHENING - Deep Abdominals, Pelvic Tilt   Lie on a firm bed or floor. Keeping your legs in front of you, bend your knees so they are both pointed toward the ceiling and your feet are flat on the floor.  Tense your lower abdominal muscles to press your low back into the floor. This motion will rotate your pelvis so that your tail bone is scooping upwards rather than pointing at your feet or into the floor.  With a gentle tension and even breathing, hold this position for __________ seconds. Repeat __________ times. Complete this exercise __________ times per day.  STRENGTHENING - Abdominals, Crunches   Lie on a firm bed or floor. Keeping your legs in front of you, bend your knees so they are both pointed toward the ceiling and your feet are flat on the floor. Cross your arms over your chest.  Slightly tip your chin down without bending your neck.  Tense your abdominals and slowly lift your trunk high enough to just clear your shoulder blades. Lifting higher can put excessive stress on the low back and does not further strengthen your abdominal muscles.  Control your return to the starting position. Repeat __________ times. Complete this exercise __________ times per day.  STRENGTHENING - Quadruped, Opposite UE/LE Lift   Assume a hands and knees position on a firm surface. Keep your hands under your shoulders and your knees under your hips. You may place padding under your knees for comfort.  Find your neutral spine and gently tense your abdominal muscles so that you can maintain this position. Your shoulders and hips should form a rectangle that is parallel with the floor and is not twisted.  Keeping your trunk steady, lift your right hand no higher than your shoulder and then your left leg no higher than your hip. Make sure you are not holding your breath. Hold this position __________ seconds.  Continuing to keep your  abdominal muscles tense and your back steady, slowly return to your starting position. Repeat with the opposite arm and leg. Repeat __________ times. Complete this exercise __________ times per day. Document Released: 03/10/2005 Document Revised: 05/15/2011 Document Reviewed: 06/04/2008 [facility] Patient Information 2015 [place], [place]. This information is not intended to replace advice given to you by your health care provider. Make sure you discuss any questions you have with your health care provider.

## 2014-02-28 NOTE — ED Provider Notes (Signed)
Christina Rich is a 33 y.o. female who presents to Urgent Care today for low back pain. Patient has acute onset of low back pain starting today. She has the pain is been ongoing now mildly off and on for several months. The pain is bilateral and radiates to the mid thigh. No weakness or numbness bowel bladder dysfunction or difficulty walking. Counter medications which didn't help very much. She feels well otherwise no urinary symptoms or abdominal pain.   Past Medical History  Diagnosis Date  . Abdominal pain    Past Surgical History  Procedure Laterality Date  . Cholecystectomy    . Cesarean section      x2  . Intrauterine device (iud) insertion     History  Substance Use Topics  . Smoking status: Former Smoker    Types: Cigarettes  . Smokeless tobacco: Never Used  . Alcohol Use: Yes     Comment: occasional   ROS as above Medications: Current Facility-Administered Medications  Medication Dose Route Frequency Provider Last Rate Last Dose  . HYDROcodone-acetaminophen (NORCO/VICODIN) 5-325 MG per tablet 1 tablet  1 tablet Oral Once [name], MD      . ketorolac (TORADOL) injection 60 mg  60 mg Intramuscular Once [name], MD      . methylPREDNISolone acetate PF (DEPO-MEDROL) injection 80 mg  80 mg Intramuscular Once [name], MD       Current Outpatient Prescriptions  Medication Sig Dispense Refill  . MedroxyPROGESTERone Acetate (DEPO-PROVERA IM) Inject into the muscle.    Marland Kitchen acetaminophen (TYLENOL) 500 MG tablet Take 1,000 mg by mouth every 6 (six) hours as needed for pain.    . cyclobenzaprine (FLEXERIL) 10 MG tablet Take 0.5 tablets (5 mg total) by mouth 2 (two) times daily as needed for muscle spasms. 20 tablet 0  . HYDROcodone-acetaminophen (NORCO) 5-325 MG per tablet Take 1 tablet by mouth every 6 (six) hours as needed for pain. 15 tablet 0  . ibuprofen (ADVIL,MOTRIN) 200 MG tablet Take 400 mg by mouth every 6 (six) hours as needed. For pain    .  levonorgestrel (MIRENA) 20 MCG/24HR IUD 1 each by Intrauterine route once.    . naproxen (NAPROSYN) 500 MG tablet Take 1 tablet (500 mg total) by mouth 2 (two) times daily. 60 tablet 0   No Known Allergies   Exam:  BP 120/82 mmHg  Pulse 64  Temp(Src) 98.7 F (37.1 C) (Oral)  Resp 18  SpO2 100% Gen: Well NAD in pain appearing HEENT: EOMI,  MMM Lungs: Normal work of breathing. CTABL Heart: RRR no MRG Abd: NABS, Soft. Nondistended, Nontender Exts: Brisk capillary refill, warm and well perfused.  Back: Nontender midline. Tender palpation bilateral SI joint. Patient unable to do range of motion exam because of pain. Lurched every strength is intact throughout. Patient can stand and squat and stand on her toes and heels. She has an antalgic gait. Sensation is intact throughout.  Patient was given 15/325 mg Norco, and 60 mg IM Toradol, and 80 mg IM Depo-Medrol prior to discharge.  Results for orders placed or performed during the hospital encounter of 02/28/14 (from the past 24 hour(s))  POCT urinalysis dip (device)     Status: Abnormal   Collection Time: 02/28/14  7:32 PM  Result Value Ref Range   Glucose, UA NEGATIVE NEGATIVE mg/dL   Bilirubin Urine NEGATIVE NEGATIVE   Ketones, ur NEGATIVE NEGATIVE mg/dL   Specific Gravity, Urine 1.025 1.005 - 1.030   Hgb  urine dipstick TRACE (A) NEGATIVE   pH 7.0 5.0 - 8.0   Protein, ur NEGATIVE NEGATIVE mg/dL   Urobilinogen, UA 1.0 0.0 - 1.0 mg/dL   Nitrite NEGATIVE NEGATIVE   Leukocytes, UA NEGATIVE NEGATIVE  Pregnancy, urine POC     Status: None   Collection Time: 02/28/14  7:34 PM  Result Value Ref Range   Preg Test, Ur NEGATIVE NEGATIVE   No results found.  Assessment and Plan: 33 y.o. female with lumbago. No significant sciatica. Treatment with Flexeril Norco and Voltaren. Follow up with sports medicine.  Discussed warning signs or symptoms. Please see discharge instructions. Patient expresses understanding.     [name],  MD 02/28/14 [phone]

## 2014-03-03 ENCOUNTER — Encounter: Payer: Self-pay | Admitting: Family

## 2014-03-03 ENCOUNTER — Ambulatory Visit (INDEPENDENT_AMBULATORY_CARE_PROVIDER_SITE_OTHER): Payer: BC Managed Care – PPO | Admitting: Family

## 2014-03-03 ENCOUNTER — Encounter (HOSPITAL_COMMUNITY): Payer: Self-pay | Admitting: *Deleted

## 2014-03-03 ENCOUNTER — Emergency Department (HOSPITAL_COMMUNITY)
Admission: EM | Admit: 2014-03-03 | Discharge: 2014-03-03 | Disposition: A | Discharge status: Home / Self Care | Payer: BC Managed Care – PPO | Attending: Emergency Medicine | Admitting: Emergency Medicine

## 2014-03-03 ENCOUNTER — Ambulatory Visit (INDEPENDENT_AMBULATORY_CARE_PROVIDER_SITE_OTHER)
Admission: RE | Admit: 2014-03-03 | Discharge: 2014-03-03 | Disposition: A | Discharge status: Home / Self Care | Payer: BC Managed Care – PPO | Source: Ambulatory Visit | Attending: Family | Admitting: Family

## 2014-03-03 VITALS — BP 110/78 | HR 59 | Temp 98.5°F | Resp 18 | Ht 64.0 in | Wt 166.8 lb

## 2014-03-03 DIAGNOSIS — M549 Dorsalgia, unspecified: Secondary | ICD-10-CM | POA: Diagnosis present

## 2014-03-03 DIAGNOSIS — Z79899 Other long term (current) drug therapy: Secondary | ICD-10-CM | POA: Insufficient documentation

## 2014-03-03 DIAGNOSIS — M545 Low back pain, unspecified: Secondary | ICD-10-CM | POA: Insufficient documentation

## 2014-03-03 DIAGNOSIS — Z87891 Personal history of nicotine dependence: Secondary | ICD-10-CM | POA: Diagnosis not present

## 2014-03-03 DIAGNOSIS — M5442 Lumbago with sciatica, left side: Secondary | ICD-10-CM

## 2014-03-03 MED ORDER — IBUPROFEN 800 MG PO TABS: 800.0000 mg | ORAL_TABLET | Freq: Three times a day (TID) | ORAL | Status: DC

## 2014-03-03 MED ORDER — HYDROMORPHONE HCL 1 MG/ML IJ SOLN
1.0000 mg | Freq: Once | INTRAMUSCULAR | Status: AC
Start: 2014-03-03 — End: 2014-03-03
  Administered 2014-03-03: 1 mg via INTRAMUSCULAR
  Filled 2014-03-03: qty 1

## 2014-03-03 MED ORDER — OXYCODONE-ACETAMINOPHEN 5-325 MG PO TABS: 1.0000 | ORAL_TABLET | Freq: Four times a day (QID) | ORAL | Status: DC | PRN

## 2014-03-03 MED ORDER — IBUPROFEN 800 MG PO TABS
800.0000 mg | ORAL_TABLET | Freq: Once | ORAL | Status: AC
  Administered 2014-03-03: 800 mg via ORAL
  Filled 2014-03-03: qty 1

## 2014-03-03 MED ORDER — POLYETHYLENE GLYCOL 3350 17 GM/SCOOP PO POWD
17.0000 g | Freq: Every day | ORAL | Status: DC
Start: 2014-03-03 — End: 2014-11-25

## 2014-03-03 MED ORDER — PREDNISONE 10 MG PO TABS
ORAL_TABLET | ORAL | Status: DC
Start: 2014-03-03 — End: 2014-11-25

## 2014-03-03 NOTE — Discharge Instructions (Signed)
Call for a follow up appointment with a Family or Primary Care Provider.  Return if Symptoms worsen.   Take medication as prescribed.  Call a orthopedic specialist for further evaluation of your back pain. You can ice and use a warm compress 3-4 times a day to help with symptoms.

## 2014-03-03 NOTE — ED Provider Notes (Signed)
CSN: [id number]     Arrival date & time 03/03/14  0900 History   First MD Initiated Contact with Patient 03/03/14 [phone]     Chief Complaint  Patient presents with  . Back Pain     (Consider location/radiation/quality/duration/timing/severity/associated sxs/prior Treatment) HPI Comments: The patient is a 33 year old female presents emergency room chief complaint of back pain. Patient reports initial onset of low back pain months ago, recently increase in discomfort over the last one week. She reports radiation into bilateral lower extremities, left greater than right. She reports pain exacerbated by movement and position, pressure while sitting.  Seen at [facility] 12/26 for similar complaints, reports compliance with Voltaren, Flexeril, and Norco without resolution of symptoms. Patient denies dysuria, hematuria, bowel or bladder incontinence. Patient denies saddle paresthesias. Patient reports increase in pain with walking, denies fall or recent injury. No history of IV drug use, back procedures.  Patient is a 33 y.o. female presenting with back pain. The history is provided by the patient. No language interpreter was used.  Back Pain Associated symptoms: numbness   Associated symptoms: no abdominal pain and no fever     Past Medical History  Diagnosis Date  . Abdominal pain    Past Surgical History  Procedure Laterality Date  . Cholecystectomy    . Cesarean section      x2  . Intrauterine device (iud) insertion     History reviewed. No pertinent family history. History  Substance Use Topics  . Smoking status: Former Smoker    Types: Cigarettes  . Smokeless tobacco: Never Used  . Alcohol Use: Yes     Comment: occasional   OB History    No data available     Review of Systems  Constitutional: Negative for fever and chills.  Gastrointestinal: Negative for vomiting, abdominal pain, diarrhea and constipation.  Genitourinary: Negative for hematuria and difficulty urinating.   Musculoskeletal: Positive for myalgias and back pain.  Skin: Negative for wound.  Neurological: Positive for numbness.      Allergies  Review of patient's allergies indicates no known allergies.  Home Medications   Prior to Admission medications   Medication Sig Start Date End Date Taking? Authorizing Provider  cyclobenzaprine (FLEXERIL) 5 MG tablet Take 1 tablet (5 mg total) by mouth at bedtime as needed for muscle spasms. 02/28/14   [name], MD  diclofenac (VOLTAREN) 50 MG EC tablet Take 1 tablet (50 mg total) by mouth 2 (two) times daily as needed. 02/28/14   [name], MD  HYDROcodone-acetaminophen (NORCO/VICODIN) 5-325 MG per tablet Take 1 tablet by mouth every 6 (six) hours as needed. 02/28/14   [name], MD   BP 107/84 mmHg  Pulse 78  Temp(Src) 98.4 F (36.9 C) (Oral)  Resp 20  Ht 5\' 4"  (1.626 m)  Wt 165 lb (74.844 kg)  BMI 28.31 kg/m2  SpO2 100% Physical Exam  Constitutional: She is oriented to person, place, and time. She appears well-developed and well-nourished.  Non-toxic appearance. She does not have a sickly appearance. She does not appear ill. No distress.  HENT:  Head: Normocephalic and atraumatic.  Neck: Neck supple.  Cardiovascular: Normal rate and regular rhythm.   Pulmonary/Chest: Effort normal. No respiratory distress. She has no wheezes. She has no rales.  Abdominal: Soft. There is no tenderness. There is no rebound.  Musculoskeletal:  Lower extremity discomfort reproducible with palpation over her SI joint.  Neurological: She is alert and oriented to person, place, and time. She  has normal strength. No sensory deficit.  Reflex Scores:      Patellar reflexes are 2+ on the right side and 2+ on the left side. Normal sensation and strength to bilateral lower extremities.  Skin: Skin is warm and dry. She is not diaphoretic.  Psychiatric: She has a normal mood and affect. Her behavior is normal.  Nursing note and vitals reviewed.   ED  Course  Procedures (including critical care time) Labs Review Labs Reviewed - No data to display  Imaging Review No results found.   EKG Interpretation None      MDM   Final diagnoses:  Low back pain without sciatica, unspecified back pain laterality   Patient with back pain.  No neurological deficits and normal neuro exam.  Patient can walk but states is painful.  No loss of bowel or bladder control. Likely sciatic pain.  No concern for cauda equina.  No fever, night sweats, weight loss, h/o cancer, IVDU. RICE protocol and pain medicine indicated and discussed with patient.   1110 reevaluation patient resting comfortably in room. Reports moderate resolution of symptoms after pain medication. Discussed treatment plan with the patient. Return precautions given. Reports understanding and no other concerns at this time.  Patient is stable for discharge at this time.  Meds given in ED:  Medications  HYDROmorphone (DILAUDID) injection 1 mg (1 mg Intramuscular Given 03/03/14 1022)  ibuprofen (ADVIL,MOTRIN) tablet 800 mg (800 mg Oral Given 03/03/14 1022)    New Prescriptions   IBUPROFEN (ADVIL,MOTRIN) 800 MG TABLET    Take 1 tablet (800 mg total) by mouth 3 (three) times daily.   OXYCODONE-ACETAMINOPHEN (PERCOCET/ROXICET) 5-325 MG PER TABLET    Take 1 tablet by mouth every 6 (six) hours as needed for severe pain.     Harvie Heck, PA-C 03/03/14 [place], MD 03/03/14 [phone]

## 2014-03-03 NOTE — Progress Notes (Signed)
Subjective:    Patient ID: Christina Rich, female    DOB: 1980/11/10, 33 y.o.   MRN: [id number]  Chief Complaint  Patient presents with  . Establish Care    Back pain x3 days,     HPI:  Christina Rich is a 33 y.o. female who presents today to establish care and discuss back pain.   Back pain - Has had previous waxing and waning back pain. Acute symptoms started on Saturday with unknown mechanism and is experiencing sharp/shooting low back pain in her lumbar spine that ranges from a 7-10/10. Currently experiencing a pain intensity of 7/10. Indicates she does have shooting pains that go down bilateral legs.  Currently taking flexeril and percocet. Has recently been seen in the ED this morning where she was given an injection of Dilaudid.   No Known Allergies  Current Outpatient Prescriptions on File Prior to Visit  Medication Sig Dispense Refill  . cyclobenzaprine (FLEXERIL) 5 MG tablet Take 1 tablet (5 mg total) by mouth at bedtime as needed for muscle spasms. 20 tablet 0  . [DISCONTINUED] levonorgestrel (MIRENA) 20 MCG/24HR IUD 1 each by Intrauterine route once.     No current facility-administered medications on file prior to visit.    Past Medical History  Diagnosis Date  . Abdominal pain   . Chicken pox     Past Surgical History  Procedure Laterality Date  . Cholecystectomy    . Cesarean section      x2  . Intrauterine device (iud) insertion      History   Social History  . Marital Status: Single    Spouse Name: N/A    Number of Children: 2  . Years of Education: 16   Occupational History  . 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 Smoking status: Former Smoker    Types: Cigarettes  . Smokeless tobacco: Never Used  . Alcohol Use: Yes     Comment: occasional  . Drug Use: No  . Sexual Activity: Yes    Birth Control/ Protection: Injection   Other Topics Concern  . Not on file   Social History Narrative   Born and raised  in [place], [place]. Currently reside in a house with my husband and 2 children. 1 dog. Fun: Relax.   Denies religious beliefs that would effect health care.     Review of Systems  Genitourinary:       Denies change in bowel or bladder habits.   Musculoskeletal: Positive for back pain.  Neurological: Negative for weakness and numbness.      Objective:    BP 110/78 mmHg  Pulse 59  Temp(Src) 98.5 F (36.9 C) (Oral)  Resp 18  Ht 5\' 4"  (1.626 m)  Wt 166 lb 12.8 oz (75.66 kg)  BMI 28.62 kg/m2  SpO2 99% Nursing note and vital signs reviewed.  Physical Exam  Constitutional: She is oriented to person, place, and time. She appears well-developed and well-nourished. No distress.  Cardiovascular: Normal rate, regular rhythm, normal heart sounds and intact distal pulses.   Pulmonary/Chest: Effort normal and breath sounds normal.  Musculoskeletal:  No obvious deformity, discoloration, or edema of lumbar spine noted. Patient does have accentuated lordotic curve. Tenderness elicited along lower lumbar spine and sacrum as well as both sciatic nerves with the left greater than the right. Exhibits decreased lumbar flexion secondary to pain. Extension, rotation, and lateral bending are intact and appropriate. Decreased reflexes noted bilaterally. Pulses are intact and appropriate  bilaterally.  Neurological: She is alert and oriented to person, place, and time.  Skin: Skin is warm and dry.  Psychiatric: She has a normal mood and affect. Her behavior is normal. Judgment and thought content normal.       Assessment & Plan:

## 2014-03-03 NOTE — Patient Instructions (Signed)
Thank you for choosing [facility].  Summary/Instructions:  Your prescription(s) have been submitted to your pharmacy. Please take as directed and contact our office if you believe you are having problem(s) with the medication(s).  If your symptoms worsen or fail to improve, please contact our office for further instruction, or in case of emergency go directly to the emergency room at the closest medical facility.    Back Pain, Adult Low back pain is very common. About 1 in 5 people have back pain.The cause of low back pain is rarely dangerous. The pain often gets better over time.About half of people with a sudden onset of back pain feel better in just 2 weeks. About 8 in 10 people feel better by 6 weeks.  CAUSES Some common causes of back pain include:  Strain of the muscles or ligaments supporting the spine.  Wear and tear (degeneration) of the spinal discs.  Arthritis.  Direct injury to the back. DIAGNOSIS Most of the time, the direct cause of low back pain is not known.However, back pain can be treated effectively even when the exact cause of the pain is unknown.Answering your caregiver's questions about your overall health and symptoms is one of the most accurate ways to make sure the cause of your pain is not dangerous. If your caregiver needs more information, he or she may order lab work or imaging tests (X-rays or MRIs).However, even if imaging tests show changes in your back, this usually does not require surgery. HOME CARE INSTRUCTIONS For many people, back pain returns.Since low back pain is rarely dangerous, it is often a condition that people can learn to [facility] their own.   Remain active. It is stressful on the back to sit or stand in one place. Do not sit, drive, or stand in one place for more than 30 minutes at a time. Take short walks on level surfaces as soon as pain allows.Try to increase the length of time you walk each day.  Do not stay in  bed.Resting more than 1 or 2 days can delay your recovery.  Do not avoid exercise or work.Your body is made to move.It is not dangerous to be active, even though your back may hurt.Your back will likely heal faster if you return to being active before your pain is gone.  Pay attention to your body when you bend and lift. Many people have less discomfortwhen lifting if they bend their knees, keep the load close to their bodies,and avoid twisting. Often, the most comfortable positions are those that put less stress on your recovering back.  Find a comfortable position to sleep. Use a firm mattress and lie on your side with your knees slightly bent. If you lie on your back, put a pillow under your knees.  Only take over-the-counter or prescription medicines as directed by your caregiver. Over-the-counter medicines to reduce pain and inflammation are often the most helpful.Your caregiver may prescribe muscle relaxant drugs.These medicines help dull your pain so you can more quickly return to your normal activities and healthy exercise.  Put ice on the injured area.  Put ice in a plastic bag.  Place a towel between your skin and the bag.  Leave the ice on for 15-20 minutes, 03-04 times a day for the first 2 to 3 days. After that, ice and heat may be alternated to reduce pain and spasms.  Ask your caregiver about trying back exercises and gentle massage. This may be of some benefit.  Avoid feeling anxious  or stressed.Stress increases muscle tension and can worsen back pain.It is important to recognize when you are anxious or stressed and learn ways to manage it.Exercise is a great option. SEEK MEDICAL CARE IF:  You have pain that is not relieved with rest or medicine.  You have pain that does not improve in 1 week.  You have new symptoms.  You are generally not feeling well. SEEK IMMEDIATE MEDICAL CARE IF:   You have pain that radiates from your back into your legs.  You  develop new bowel or bladder control problems.  You have unusual weakness or numbness in your arms or legs.  You develop nausea or vomiting.  You develop abdominal pain.  You feel faint. Document Released: 02/20/2005 Document Revised: 08/22/2011 Document Reviewed: 06/24/2013 [facility] Patient Information 2015 [place], [place]. This information is not intended to replace advice given to you by your health care provider. Make sure you discuss any questions you have with your health care provider.

## 2014-03-03 NOTE — ED Notes (Signed)
States  she started  Having back pain on sat was seen at urgent care and was given medication without relief. . States the pain isn';t getting better. Hurts worse to sit.

## 2014-03-03 NOTE — Assessment & Plan Note (Signed)
Patient continues to experience low back pain with radiculopathy. Obtain lumbar x-rays. Start prednisone taper. Continue previously prescribed medications for pain and muscle spasm at the respective doses. Follow up pending x-rays and steroid taper.

## 2014-03-03 NOTE — Progress Notes (Signed)
Pre visit review using our clinic review tool, if applicable. No additional management support is needed unless otherwise documented below in the visit note. 

## 2014-03-04 ENCOUNTER — Telehealth: Payer: Self-pay | Admitting: Family

## 2014-03-04 NOTE — Telephone Encounter (Signed)
Called and let pt know the results of xray.

## 2014-03-04 NOTE — Telephone Encounter (Signed)
Please call and inform patient that her x-rays were normal and showed no fractures or other abnormalities. Therefore continue the prednisone at this time and if there is no improvement we may need to obtain an MRI.

## 2014-03-09 ENCOUNTER — Telehealth: Payer: Self-pay | Admitting: Family

## 2014-03-09 NOTE — Telephone Encounter (Signed)
[facility] called in and said that pt wants to transfer all med to them.  He is requesting refill for simvastatin (ZOCOR) 20 MG tablet [[id number]]  ° °Please send to below pharmacy  ° ° °[facility] °[phone] °[address] °[place] [address] °

## 2014-03-09 NOTE — Telephone Encounter (Signed)
Pt stated that she is still in pain and has an appointment tomorrow. She is going to want to get an MRI done to check further into the pain. She stated that she needs a work note from the 29th-31st bc she was out due to the pain. Her job involves her sitting down a lot and her back seems to hurt worse after sitting down for a long period then trying to stand the pain is mainly at the tailbone area and it is better when she is moving more. She is also going to bring FMLA papers to her appointment tomorrow to go over. She is on pain meds which she said makes it hard for her to function bc it makes her drowsy.

## 2014-03-10 ENCOUNTER — Ambulatory Visit (INDEPENDENT_AMBULATORY_CARE_PROVIDER_SITE_OTHER): Payer: BLUE CROSS/BLUE SHIELD | Admitting: Family

## 2014-03-10 ENCOUNTER — Encounter: Payer: Self-pay | Admitting: Family

## 2014-03-10 VITALS — BP 142/108 | HR 60 | Temp 98.5°F | Resp 18 | Wt 162.1 lb

## 2014-03-10 DIAGNOSIS — M545 Low back pain, unspecified: Secondary | ICD-10-CM

## 2014-03-10 NOTE — Assessment & Plan Note (Addendum)
Patient continues to experience low back pain however peripheral symptoms have vastly improved with the prednisone. Refer to physical therapy. Obtain MRI to rule out disc pathology. Continue current dosage of ibuprofen as needed for pain relief. Follow up as needed. FMLA papers received from patient for 12/29-12/31.

## 2014-03-10 NOTE — Telephone Encounter (Signed)
Patient seen in office 03/10/2013.

## 2014-03-10 NOTE — Patient Instructions (Signed)
Thank you for choosing [facility].  Summary/Instructions:  Referrals have been made during this visit. You should expect to hear back from our schedulers in about 7-10 days in regards to establishing an appointment with the specialists we discussed.   If your symptoms worsen or fail to improve, please contact our office for further instruction, or in case of emergency go directly to the emergency room at the closest medical facility.

## 2014-03-10 NOTE — Progress Notes (Signed)
Pre visit review using our clinic review tool, if applicable. No additional management support is needed unless otherwise documented below in the visit note. 

## 2014-03-10 NOTE — Progress Notes (Signed)
   Subjective:    Patient ID: Clayburn Pert, female    DOB: 1980/10/28, 34 y.o.   MRN: [id number]  Chief Complaint  Patient presents with  . Follow-up    Has FMLA paper work, lower back pain    HPI:  Aidaly Cordner is a 34 y.o. female who presents today for follow up of back pain.   Previously seen with midline low back pain that was maintained on cylcobenzaprine and percocet. She was tried on prednisone. Indicates that she is feeling a little better, however sitting and getting up first thing in the morning. Pain is described as annoying and sharp and rated a 7/10.  Pain remains in the center of her back and now has some localization towards the right side. States the peripheral symptoms have improved. Has decreased use of the pain medication and muscle relaxer.  Patient would also like FMLA forms filled in for the days she missed.   Review of Systems  Musculoskeletal: Positive for back pain.  Neurological: Negative for numbness.      Objective:    BP 142/108 mmHg  Pulse 60  Temp(Src) 98.5 F (36.9 C) (Oral)  Resp 18  Wt 162 lb 1.9 oz (73.537 kg)  SpO2 98% Nursing note and vital signs reviewed.  Physical Exam  Constitutional: She is oriented to person, place, and time. She appears well-developed and well-nourished. No distress.  Cardiovascular: Normal rate, regular rhythm, normal heart sounds and intact distal pulses.   Pulmonary/Chest: Effort normal and breath sounds normal.  Musculoskeletal:  Tenderness remains in the lumbar region and sacrum. Extension, rotation, and lateral bending are intact and appropriate. Decreased reflexes noted bilaterally. Pulses are intact and appropriate bilaterally.  Neurological: She is alert and oriented to person, place, and time.  Skin: Skin is warm and dry.  Psychiatric: She has a normal mood and affect. Her behavior is normal. Judgment and thought content normal.       Assessment & Plan:

## 2014-03-11 ENCOUNTER — Telehealth: Payer: Self-pay

## 2014-03-11 NOTE — Telephone Encounter (Signed)
Attempted to call pt to let her know that her papers were faxed.

## 2014-03-13 ENCOUNTER — Encounter: Payer: Self-pay | Admitting: Family

## 2014-04-06 ENCOUNTER — Ambulatory Visit: Payer: BLUE CROSS/BLUE SHIELD | Attending: Family | Admitting: Physical Therapy

## 2014-04-06 ENCOUNTER — Telehealth: Payer: Self-pay | Admitting: *Deleted

## 2014-04-06 DIAGNOSIS — M5442 Lumbago with sciatica, left side: Secondary | ICD-10-CM | POA: Diagnosis not present

## 2014-04-06 NOTE — Telephone Encounter (Signed)
APPTS MADE AND PRINTED...TD 

## 2014-04-06 NOTE — Patient Instructions (Signed)
Sleeping on Back  Place pillow under knees. A pillow with cervical support and a roll around waist are also helpful. Copyright  VHI. All rights reserved.  Sleeping on Side Place pillow between knees. Use cervical support under neck and a roll around waist as needed. Copyright  VHI. All rights reserved.   Sleeping on Stomach   If this is the only desirable sleeping position, place pillow under lower legs, and under stomach or chest as needed.  Posture - Sitting   Sit upright, head facing forward. Try using a roll to support lower back. Keep shoulders relaxed, and avoid rounded back. Keep hips level with knees. Avoid crossing legs for long periods. Stand to Sit / Sit to Stand   To sit: Bend knees to lower self onto front edge of chair, then scoot back on seat. To stand: Reverse sequence by placing one foot forward, and scoot to front of seat. Use rocking motion to stand up.   Work Height and Reach  Ideal work height is no more than 2 to 4 inches below elbow level when standing, and at elbow level when sitting. Reaching should be limited to arm's length, with elbows slightly bent.  Bending  Bend at hips and knees, not back. Keep feet shoulder-width apart.    Posture - Standing   Good posture is important. Avoid slouching and forward head thrust. Maintain curve in low back and align ears over shoul- ders, hips over ankles.  Alternating Positions   Alternate tasks and change positions frequently to reduce fatigue and muscle tension. Take rest breaks. Computer Work   Position work to face forward. Use proper work and seat height. Keep shoulders back and down, wrists straight, and elbows at right angles. Use chair that provides full back support. Add footrest and lumbar roll as needed.  Getting Into / Out of Car  Lower self onto seat, scoot back, then bring in one leg at a time. Reverse sequence to get out.  Dressing  Lie on back to pull socks or slacks over feet, or sit  and bend leg while keeping back straight.    Housework - Sink  Place one foot on ledge of cabinet under sink when standing at sink for prolonged periods.   Pushing / Pulling  Pushing is preferable to pulling. Keep back in proper alignment, and use leg muscles to do the work.  Deep Squat   Squat and lift with both arms held against upper trunk. Tighten stomach muscles without holding breath. Use smooth movements to avoid jerking.  Avoid Twisting   Avoid twisting or bending back. Pivot around using foot movements, and bend at knees if needed when reaching for articles.  Carrying Luggage   Distribute weight evenly on both sides. Use a cart whenever possible. Do not twist trunk. Move body as a unit.   Lifting Principles .Maintain proper posture and head alignment. .Slide object as close as possible before lifting. .Move obstacles out of the way. .Test before lifting; ask for help if too heavy. .Tighten stomach muscles without holding breath. .Use smooth movements; do not jerk. .Use legs to do the work, and pivot with feet. .Distribute the work load symmetrically and close to the center of trunk. .Push instead of pull whenever possible.   Ask For Help   Ask for help and delegate to others when possible. Coordinate your movements when lifting together, and maintain the low back curve.  Log Roll   Lying on back, bend left knee and place left   arm across chest. Roll all in one movement to the right. Reverse to roll to the left. Always move as one unit. Housework - Sweeping  Use long-handled equipment to avoid stooping.   Housework - Wiping  Position yourself as close as possible to reach work surface. Avoid straining your back.  Laundry - Unloading Wash   To unload small items at bottom of washer, lift leg opposite to arm being used to reach.  [place] close to area to be raked. Use arm movements to do the work. Keep back straight and avoid  twisting.     Cart  When reaching into cart with one arm, lift opposite leg to keep back straight.   Getting Into / Out of Bed  Lower self to lie down on one side by raising legs and lowering head at the same time. Use arms to assist moving without twisting. Bend both knees to roll onto back if desired. To sit up, start from lying on side, and use same move-ments in reverse. Housework - Vacuuming  Hold the vacuum with arm held at side. Step back and forth to move it, keeping head up. Avoid twisting.   Laundry - IT consultant so that bending and twisting can be avoided.   Laundry - Unloading Dryer  Squat down to reach into clothes dryer or use a reacher.  Gardening - Weeding / Probation officer or Kneel. Knee pads may be helpful.                    Reducing Load   Copyright  VHI. All rights reserved.  BODY MECHANICS Tips Good body mechanics are important during activities of daily living. The practice of good body mechanics will: -help distribute weight throughout the skeleton in a more anatomically correct manner thus stimulating more normal forces on the bones, and encouraging stronger, healthier, denser bones. -reduce unnatural forces on bones, ligaments, joints and muscles and reduce risk of fracture, other injury or back pain. A WORD ON BODY POSITIONING: Sitting is the hardest position for the back. Lying on the back is the easiest. Standing, in good body alignment, is somewhere between. A good motto is: Sit less, stand more, and, when you can't do that, lie down on your back and exercise to strengthen it.  Copyright  VHI. All rights reserved.       Supine to Sit (Active)   Lie on back, left leg bent. Roll to other side. From side-lying, sit up on side of bed. Complete ___ sets of ___ repetitions. Perform ___ sessions per day.  Copyright  VHI. All rights reserved.    Housework - Reaching Down   If you are unable to  bend your knees or squat, use a lazy Manuela Schwartz to keep items within easy reach. Store only light, unbreakable items on the lowest shelves, and use a reacher to pick them up.  Copyright  VHI. All rights reserved.  Low Shelf   Squat down, and bring item close to lift.   Copyright  VHI. All rights reserved.  Lifting Principles .Maintain proper posture and head alignment. .Slide object as close as possible before lifting. .Move obstacles out of the way. .Test before lifting; ask for help if too heavy. .Tighten stomach muscles without holding breath. .Use smooth movements; do not jerk. .Use legs to do the work, and pivot with feet. .Distribute the work load symmetrically and close to the center of trunk. .Push instead of  pull whenever possible.  Copyright  VHI. All rights reserved.  Posture - Standing   Good posture is important. Avoid slouching and forward head thrust. Maintain curve in low back and align ears over shoul- ders, hips over ankles.   Copyright  VHI. All rights reserved.   Posture - Sitting   Sit upright, head facing forward. Try using a roll to support lower back. Keep shoulders relaxed, and avoid rounded back. Keep hips level with knees. Avoid crossing legs for long periods.   Copyright  VHI. All rights reserved.  Ideal Posture Use with figures on 3 (2 of 2): 1.Head erect 2.Chin in 3.Chest and navel aligned 4.Spinal curves maintained 5.Knees relaxed 6.Shoulders and hips aligned 7.Feet slightly apart 8.Toes and arches active 9.Abdomen taut (breathe with diaphragm) 10.Arms at sides Ideal posture is: -pain free. -achieved with practice, mindful interest, and body awareness.  Copyright  VHI. All rights reserved.     Move heavy items one at a time, or move portions of the contents.   Posture Awareness     Stand and check posture: Jut chin, pull back to comfortable position. Tilt pelvis forward, back; be sure back is not swayed. Roll from heels to balls  of feet, then distribute your weight evenly. Picture a line through spine pulling you erect. Focus on breathing. Good Posture = Better Breathing. Check ____ times per day.  http://gt2.exer.us/873   Copyright  VHI. All rights reserved.

## 2014-04-06 NOTE — Therapy (Signed)
[place], [place], [address] Phone: [phone]   Fax:  [phone]  Physical Therapy Evaluation  Patient Details  Name: Christina Rich MRN: [id number] Date of Birth: [birth year] Referring Provider:  Mauricio Po, FNP  Encounter Date: 04/06/2014      PT End of Session - 04/06/14 1142    Visit Number 1   Number of Visits 12   Date for PT Re-Evaluation 05/18/14   PT Start Time 1021   PT Stop Time 1115   PT Time Calculation (min) 54 min   Activity Tolerance Patient tolerated treatment well      Past Medical History  Diagnosis Date  . Abdominal pain   . Chicken pox     Past Surgical History  Procedure Laterality Date  . Cholecystectomy    . Cesarean section      x2  . Intrauterine device (iud) insertion      There were no vitals taken for this visit.  Visit Diagnosis:  Midline low back pain with left-sided sciatica      Subjective Assessment - 04/06/14 1021    Symptoms Patient reports ongoing back pain, severe in late Dec. (had difficulty walking upright).  She has since improved.  She F/U with MD. She cont with pain in low back, esp in AM, diff with transition sit to stand.     Limitations Sitting;Standing   How long can you sit comfortably? 1-2 hours, uses heat   How long can you stand comfortably? as long as she shifts weight, can do a couple hours   How long can you walk comfortably? unsure but movement usually helps   Diagnostic tests neg XR, has MRI in 1 week   Patient Stated Goals be more comfortable esp with sitting/work   Currently in Pain? Yes   Pain Score 6    Pain Location Back   Pain Orientation Lower;Right;Left   Pain Descriptors / Indicators Burning;Sore  Rt burns   Pain Type Chronic pain   Pain Radiating Towards buttocks and was in LLE in late Dec.    Pain Onset More than a month ago   Pain Frequency Intermittent   Aggravating Factors  prolonged positions,  standing from a sitting position, AM   Pain Relieving Factors Aleve, heat, changing positions   Multiple Pain Sites No          [facility] PT Assessment - 04/06/14 1033    Assessment   Medical Diagnosis midline low back pain without sciatica   Onset Date 02/28/14   Next MD Visit unsure   Prior Therapy No    Precautions   Precautions None   Restrictions   Weight Bearing Restrictions No   Balance Screen   Has the patient fallen in the past 6 months No   Has the patient had a decrease in activity level because of a fear of falling?  No   Is the patient reluctant to leave their home because of a fear of falling?  No   Home Environment   Additional Comments married with children   Prior Function   Vocation Full time employment   Vocation Requirements seated, insurance    Cognition   Overall Cognitive Status Within Functional Limits for tasks assessed   Sensation   Light Touch Appears Intact   Additional Comments reports numbness but intact in eval   Posture/Postural Control   Posture/Postural Control Postural limitations   Postural Limitations Forward head;Increased lumbar lordosis;Left pelvic obliquity  Posture Comments high on L   AROM   Lumbar Flexion WNL  diff standing to neutral   Lumbar Extension WNL  WNL no pain    Lumbar - Right Side Bend WNL   incr stretch on L   Lumbar - Left Side Bend WNL   Lumbar - Right Rotation WNL  pain   Lumbar - Left Rotation WNL    Strength   Right Hip Flexion 4-/5   Right Hip Extension 4/5   Right Hip ABduction 4-/5   Left Hip Flexion 3+/5   Left Hip Extension 4/5   Left Hip ABduction 3+/5   Right Knee Flexion 4/5   Right Knee Extension 4+/5   Left Knee Flexion 5/5   Left Knee Extension 5/5   Right Ankle Dorsiflexion 3+/5   Left Ankle Dorsiflexion 4+/5   Palpation   Palpation hypertrophic L. paraspinals    Special Tests    Special Tests Lumbar   Straight Leg Raise   Findings Negative   Side  Left   Comment neg bilateral.               [facility] Adult PT Treatment/Exercise - 04/06/14 1033    Modalities   Modalities Moist Heat   Moist Heat Therapy   Number Minutes Moist Heat 15 Minutes   Moist Heat Location --  low back in prone           PT Education - 04/06/14 1141    Education provided Yes   Education Details PT/POC, HEP and nerve root irritation, posture/lifting   Person(s) Educated Patient   Methods Explanation;Demonstration;Handout   Comprehension Verbalized understanding;Returned demonstration          PT Short Term Goals - 04/06/14 1239    PT SHORT TERM GOAL #1   Title Pt. will be I with HEP   Time 3   Period Weeks   Status New   PT SHORT TERM GOAL #2   Title Pt. will be able to report less pain/sx in LE    Time 3   Period Weeks   Status New           PT Long Term Goals - 04/06/14 1240    PT LONG TERM GOAL #1   Title Pt. will be able to demo safe lifting, posture and body mechanics for prevention of re-injury.   Time 6   Period Weeks   Status New   PT LONG TERM GOAL #2   Title Pt. will be able to sit as needed for work with min discomfort   Time 6   Period Weeks   Status New   PT LONG TERM GOAL #3   Title Pt. will be able to report min occasional stiffness in low back upon waking which resolves quickly   Time 6   Period Weeks   Status New   PT LONG TERM GOAL #4   Title Pt. will report no LE sx with normal ADLs, work activities.    Time 6   Period Weeks   Status New               Plan - 04/06/14 1143    Clinical Impression Statement This patient presents with sign and symptoms consistent with nerve root irritation, exhibits weakness in Rt. LE but radicular pain in LLE. She will benefit from skilled PT to impact improved functional in the home and at work.     Pt will benefit from skilled therapeutic intervention in order to  improve on the following deficits Decreased range of motion;Difficulty walking;Impaired flexibility;Improper body mechanics;Postural  dysfunction;Impaired sensation;Decreased endurance;Decreased activity tolerance;Increased fascial restricitons;Pain;Decreased mobility;Decreased strength   Rehab Potential Excellent   PT Frequency 2x / week   PT Duration 6 weeks   PT Treatment/Interventions ADLs/Self Care Home Management;Moist Heat;Therapeutic activities;Patient/family education;Passive range of motion;Therapeutic exercise;Traction;Ultrasound;Gait training;Balance training;Manual techniques;Dry needling;Neuromuscular re-education;Cryotherapy;Electrical Stimulation;Functional mobility training   PT Next Visit Plan establish HEP   PT Home Exercise Plan gave posture and body mech   Consulted and Agree with Plan of Care Patient         Problem List Patient Active Problem List   Diagnosis Date Noted  . Low back pain 03/03/2014    Kaisa Wofford 04/06/2014, 12:46 PM  [facility] [address] [place], [place], [address] Phone: [phone]   Fax:  [phone]

## 2014-04-11 ENCOUNTER — Inpatient Hospital Stay: Admission: RE | Admit: 2014-04-11 | Payer: Self-pay | Source: Ambulatory Visit

## 2014-04-14 ENCOUNTER — Ambulatory Visit: Payer: BLUE CROSS/BLUE SHIELD | Admitting: Physical Therapy

## 2014-04-14 ENCOUNTER — Encounter: Payer: Self-pay | Admitting: Physical Therapy

## 2014-04-14 DIAGNOSIS — M5442 Lumbago with sciatica, left side: Secondary | ICD-10-CM

## 2014-04-14 NOTE — Patient Instructions (Signed)
Bridge   Pelvic Tilt   Flatten back by tightening stomach muscles and buttocks. Repeat __5-10__ times per set. Do _1-2___ sets per session. Do __2_ sessions per day.  http://orth.exer.us/134      Lie back, legs bent. Inhale, pressing hips up. Keeping ribs in, lengthen lower back. Exhale, rolling down along spine from top. Repeat ___10_ times. Do _2___ sessions per day.   Copyright  VHI. All rights reserved. Knee to Chest (Flexion)   Pull knee toward chest. Feel stretch in lower back or buttock area. Breathing deeply, Hold __20-30__ seconds. Repeat with other knee. Repeat ___3-5_ times. Do ___2_ sessions per day.  http://gt2.exer.us/225   Copyright  VHI. All rights reserved.   Lower Trunk Rotation Stretch   Keeping back flat and feet together, rotate knees to left side. Hold _10___ seconds. Repeat 10____ times per set. Do __1-2__ sets per session. Do _2___ sessions per day.  http://orth.exer.us/122   Copyright  VHI. All rights reserved.  Supine: Leg Stretch With Strap (Basic)   Lie on back with one knee bent, foot flat on floor. Hook strap around other foot. Straighten knee. Keep knee level with other knee. Hold __20-30_ seconds. Relax leg completely down to floor.  Repeat _3-5_ times per session. Do _2__ sessions per day.

## 2014-04-14 NOTE — Therapy (Signed)
[place], [place], [address] Phone: [phone]   Fax:  [phone]  Physical Therapy Treatment  Patient Details  Name: Manvir Thorson MRN: [id number] Date of Birth: June 19, 1980 Referring Provider:  Mauricio Po, FNP  Encounter Date: 04/14/2014      PT End of Session - 04/14/14 0829    Visit Number 2   Number of Visits 12   Date for PT Re-Evaluation 05/18/14   PT Start Time 0816   PT Stop Time 0900   PT Time Calculation (min) 44 min      Past Medical History  Diagnosis Date  . Abdominal pain   . Chicken pox     Past Surgical History  Procedure Laterality Date  . Cholecystectomy    . Cesarean section      x2  . Intrauterine device (iud) insertion      There were no vitals taken for this visit.  Visit Diagnosis:  Midline low back pain with left-sided sciatica      Subjective Assessment - 04/14/14 0820    Symptoms Pt. late, has been able to look at the body mechanics sheet.  No questions.    Currently in Pain? Yes   Pain Score 6    Pain Location Back   Pain Orientation Right;Left;Lower   Pain Type Chronic pain   Multiple Pain Sites No          [facility] Adult PT Treatment/Exercise - 04/14/14 0823    Lumbar Exercises: Stretches   Single Knee to Chest Stretch 3 reps;20 seconds   Lower Trunk Rotation 10 seconds   Lower Trunk Rotation Limitations x10   Pelvic Tilt 5 reps;10 seconds   Lumbar Exercises: Supine   Bridge 10 reps   Bridge Limitations decr ROM, pain   Lumbar Exercises: Prone   Other Prone Lumbar Exercises prone increased LLT numbness- deferred   Traction   Type of Traction Lumbar   Min (lbs) 40   Max (lbs) 70   Hold Time 60   Rest Time 15   Time 15           PT Education - 04/14/14 0829    Education provided Yes   Education Details HEP, traction, ice post if pain incr   Person(s) Educated Patient   Methods Explanation;Demonstration;Handout   Comprehension Verbalized understanding;Returned demonstration          PT Short Term Goals - 04/14/14 0846    PT SHORT TERM GOAL #1   Title Pt. will be I with HEP   Status On-going   PT SHORT TERM GOAL #2   Title Pt. will be able to report less pain/sx in LE    Status On-going           PT Long Term Goals - 04/14/14 0847    PT LONG TERM GOAL #1   Title Pt. will be able to demo safe lifting, posture and body mechanics for prevention of re-injury.   Status On-going   PT LONG TERM GOAL #2   Title Pt. will be able to sit as needed for work with min discomfort   Status On-going   PT LONG TERM GOAL #3   Title Pt. will be able to report min occasional stiffness in low back upon waking which resolves quickly   PT LONG TERM GOAL #4   Title Pt. will report no LE sx with normal ADLs, work activities.    Status On-going  Plan - 04/14/14 7011    Clinical Impression Statement No goals met, able to establish HEP and trial of lumbar traction.    PT Next Visit Plan review HEP, assess traction   PT Home Exercise Plan basic stretches supine   Consulted and Agree with Plan of Care Patient        Problem List Patient Active Problem List   Diagnosis Date Noted  . Low back pain 03/03/2014    Eimi Viney 04/14/2014, 12:01 PM  [facility] [address] [place], [place], [address] Phone: [phone]   Fax:  [phone]

## 2014-04-17 ENCOUNTER — Ambulatory Visit: Payer: BLUE CROSS/BLUE SHIELD | Admitting: Physical Therapy

## 2014-04-17 DIAGNOSIS — M5442 Lumbago with sciatica, left side: Secondary | ICD-10-CM

## 2014-04-17 NOTE — Patient Instructions (Signed)
Corrected HEP

## 2014-04-17 NOTE — Therapy (Signed)
[place], [place], [address] Phone: [phone]   Fax:  [phone]  Physical Therapy Treatment  Patient Details  Name: Christina Rich MRN: [id number] Date of Birth: August 05, 1980 Referring Provider:  Mauricio Po, FNP  Encounter Date: 04/17/2014      PT End of Session - 04/17/14 0844    Visit Number 3   Number of Visits 12   Date for PT Re-Evaluation 05/18/14   PT Start Time 0810   PT Stop Time 0900   PT Time Calculation (min) 50 min   Activity Tolerance Patient limited by pain      Past Medical History  Diagnosis Date  . Abdominal pain   . Chicken pox     Past Surgical History  Procedure Laterality Date  . Cholecystectomy    . Cesarean section      x2  . Intrauterine device (iud) insertion      There were no vitals taken for this visit.  Visit Diagnosis:  Midline low back pain with left-sided sciatica      Subjective Assessment - 04/17/14 0816    Symptoms Was sore after the traction but unable to say if it helped or not.  Explained she should expect soreness but goal would be to reduce LE pain.    Currently in Pain? Yes   Pain Score 7    Pain Location Back   Pain Radiating Towards not in LE today   Multiple Pain Sites No                    [facility] Adult PT Treatment/Exercise - 04/17/14 0817    Lumbar Exercises: Stretches   Single Knee to Chest Stretch 3 reps;20 seconds   Lower Trunk Rotation 5 reps;10 seconds   Lower Trunk Rotation Limitations --  x10   Pelvic Tilt 5 reps   Lumbar Exercises: Aerobic   Stationary Bike NuStep    Lumbar Exercises: Supine   Bridge --  unable due to pain   Lumbar Exercises: Quadruped   Madcat/Old Horse 5 reps   Other Quadruped Lumbar Exercises childs pose FW and lateral x 3   Moist Heat Therapy   Number Minutes Moist Heat 15 Minutes   Moist Heat Location Other (comment)  lumbar   Electrical Stimulation   Electrical  Stimulation Location low back   Electrical Stimulation Action IFC   Electrical Stimulation Parameters 16   Electrical Stimulation Goals Pain   Manual Therapy   Manual Therapy --  stretch to L side in sidelying to lengthen QL, trunk                PT Education - 04/17/14 0843    Education provided Yes   Education Details avoid aggravating factors, (hyperext), IFC   Person(s) Educated Patient   Methods Explanation;Demonstration   Comprehension Verbalized understanding;Returned demonstration          PT Short Term Goals - 04/14/14 0846    PT SHORT TERM GOAL #1   Title Pt. will be I with HEP   Status On-going   PT SHORT TERM GOAL #2   Title Pt. will be able to report less pain/sx in LE    Status On-going           PT Long Term Goals - 04/14/14 0847    PT LONG TERM GOAL #1   Title Pt. will be able to demo safe lifting, posture and body mechanics for prevention of re-injury.  Status On-going   PT LONG TERM GOAL #2   Title Pt. will be able to sit as needed for work with min discomfort   Status On-going   PT LONG TERM GOAL #3   Title Pt. will be able to report min occasional stiffness in low back upon waking which resolves quickly   PT LONG TERM GOAL #4   Title Pt. will report no LE sx with normal ADLs, work activities.    Status On-going               Plan - 04/17/14 0844    Clinical Impression Statement Patient seems more inflamed today, deferred traction and addressed pain control.    PT Next Visit Plan progress ex as tolerated, begin stab and repeat modailiteis   PT Home Exercise Plan cont as previous   Consulted and Agree with Plan of Care Patient        Problem List Patient Active Problem List   Diagnosis Date Noted  . Low back pain 03/03/2014    PAA,JENNIFER 04/17/2014, 8:46 AM  [facility] [address] [place], [place], [address] Phone: [phone]   Fax:  [phone]

## 2014-04-19 ENCOUNTER — Ambulatory Visit
Admission: RE | Admit: 2014-04-19 | Discharge: 2014-04-19 | Disposition: A | Discharge status: Home / Self Care | Payer: BLUE CROSS/BLUE SHIELD | Source: Ambulatory Visit | Attending: Family | Admitting: Family

## 2014-04-19 DIAGNOSIS — M545 Low back pain, unspecified: Secondary | ICD-10-CM

## 2014-04-20 ENCOUNTER — Telehealth: Payer: Self-pay | Admitting: Family

## 2014-04-20 DIAGNOSIS — M5126 Other intervertebral disc displacement, lumbar region: Secondary | ICD-10-CM

## 2014-04-20 DIAGNOSIS — M5136 Other intervertebral disc degeneration, lumbar region: Secondary | ICD-10-CM

## 2014-04-20 NOTE — Telephone Encounter (Signed)
Please inform the patient that her MRI results show that she does have some bulging of one of her discs that is likely putting pressure on one of her nerves. Therefore I have sent a referral for neurosurgery for further management.

## 2014-04-21 ENCOUNTER — Ambulatory Visit: Payer: BLUE CROSS/BLUE SHIELD | Admitting: Rehabilitation

## 2014-04-21 NOTE — Telephone Encounter (Signed)
Pt aware of results 

## 2014-04-24 ENCOUNTER — Ambulatory Visit: Payer: BLUE CROSS/BLUE SHIELD | Admitting: Physical Therapy

## 2014-05-14 ENCOUNTER — Encounter: Payer: Self-pay | Admitting: Physical Therapy

## 2014-05-14 NOTE — Therapy (Addendum)
[employer] [place], [place], [address] Phone: [phone]   Fax:  [phone]  Patient Details  Name: Christina Rich MRN: [id number] Date of Birth: Aug 04, 1980 Referring Provider:  No ref. provider found  Encounter Date: 05/14/2014   PT Short Term Goals - 04/14/14 0846    PT SHORT TERM GOAL #1   Title Pt. will be I with HEP   Status On-going   PT SHORT TERM GOAL #2   Title Pt. will be able to report less pain/sx in LE    Status On-going           PT Long Term Goals - 04/14/14 0847    PT LONG TERM GOAL #1   Title Pt. will be able to demo safe lifting, posture and body mechanics for prevention of re-injury.   Status On-going   PT LONG TERM GOAL #2   Title Pt. will be able to sit as needed for work with min discomfort   Status On-going   PT LONG TERM GOAL #3   Title Pt. will be able to report min occasional stiffness in low back upon waking which resolves quickly   PT LONG TERM GOAL #4   Title Pt. will report no LE sx with normal ADLs, work activities.    Status On-going            PHYSICAL THERAPY DISCHARGE SUMMARY  Visits from Start of Care: 3 Current functional level related to goals / functional outcomes: See above for goals, not met overall   Remaining deficits: Pain, decr mobility, stiffness of spine, sensory changes   Education / Equipment: HEP and decompression, RICE Plan: Patient agrees to discharge.  Patient goals were not met. Patient is being discharged due to a change in medical status.  ?????   Patient awaiting Neurosurgery consult and would like to save her time off from work in case she needs surgery.  Will need new Rx if she returns.  [name] 05/14/2014, 2:50 PM  [place], [place], [address] Phone: [phone]   Fax:  [phone]

## 2014-11-25 ENCOUNTER — Encounter (HOSPITAL_COMMUNITY): Payer: Self-pay | Admitting: Emergency Medicine

## 2014-11-25 ENCOUNTER — Emergency Department (INDEPENDENT_AMBULATORY_CARE_PROVIDER_SITE_OTHER)
Admission: EM | Admit: 2014-11-25 | Discharge: 2014-11-25 | Disposition: A | Discharge status: Home / Self Care | Payer: BLUE CROSS/BLUE SHIELD | Source: Home / Self Care | Attending: Emergency Medicine | Admitting: Emergency Medicine

## 2014-11-25 ENCOUNTER — Ambulatory Visit (HOSPITAL_COMMUNITY)
Admission: RE | Admit: 2014-11-25 | Discharge: 2014-11-25 | Disposition: A | Discharge status: Home / Self Care | Payer: BLUE CROSS/BLUE SHIELD | Source: Ambulatory Visit | Attending: Emergency Medicine | Admitting: Emergency Medicine

## 2014-11-25 DIAGNOSIS — M25511 Pain in right shoulder: Secondary | ICD-10-CM

## 2014-11-25 DIAGNOSIS — M7021 Olecranon bursitis, right elbow: Secondary | ICD-10-CM

## 2014-11-25 DIAGNOSIS — M25521 Pain in right elbow: Secondary | ICD-10-CM | POA: Insufficient documentation

## 2014-11-25 MED ORDER — TRAMADOL HCL 50 MG PO TABS: 50.0000 mg | ORAL_TABLET | Freq: Four times a day (QID) | ORAL | Status: DC | PRN

## 2014-11-25 MED ORDER — MELOXICAM 15 MG PO TABS: 15.0000 mg | ORAL_TABLET | Freq: Every day | ORAL | Status: DC

## 2014-11-25 NOTE — ED Notes (Signed)
Pt tripped on clothing on her bedroom floor and fell between her bed and night stand.  She fell with full weight to her shoulder against the wall.  Pt took a muscle relaxer last night, but it did not help.  She complains of pain in her shoulder radiating across her shoulder to her neck and down her arm to her elbow.  There appears to be some swelling in the shoulder and the elbow.

## 2014-11-25 NOTE — Discharge Instructions (Signed)
You have bursitis of your elbow. You also have some inflammation in your [facility] joint in your right shoulder. Take meloxicam daily for the next week, then as needed. Apply ice to your shoulder and elbow as often as you can. This will gradually improve over the next several weeks. You can take tramadol every 6-8 hours as needed for severe pain. Do not drive all taking this medicine. Follow-up with your primary care doctor as needed.

## 2014-11-25 NOTE — ED Provider Notes (Signed)
CSN: [id number]     Arrival date & time 11/25/14  1300 History   First MD Initiated Contact with Patient 11/25/14 1308     Chief Complaint  Patient presents with  . Shoulder Injury   (Consider location/radiation/quality/duration/timing/severity/associated sxs/prior Treatment) HPI  He is a 34 year old Rich here for evaluation of right shoulder injury. She states she tripped on some close last night and fell into Christina wall landing primarily on her right elbow and shoulder. She reports pain in Christina anterior shoulder as well as Christina posterior elbow. She has full range of motion of Christina elbow, but reports discomfort at Christina posterior aspect. She also reports swelling at Christina posterior elbow. She also describes pain with overhead movements and cross body movements of Christina shoulder. She tried taking a muscle relaxer last night, but it did not help. She denies any numbness or tingling. No weakness.  Past Medical History  Diagnosis Date  . Abdominal pain   . Chicken pox    Past Surgical History  Procedure Laterality Date  . Cholecystectomy    . Cesarean section      x2  . Intrauterine device (iud) insertion     Family History  Problem Relation Age of Onset  . Hypertension Mother   . Hypertension Father   . Diabetes Father    Social History  Substance Use Topics  . Smoking status: Former Smoker    Types: Cigarettes  . Smokeless tobacco: Never Used  . Alcohol Use: Yes     Comment: occasional   OB History    No data available     Review of Systems As in history of present illness Allergies  Review of patient's allergies indicates no known allergies.  Home Medications   Prior to Admission medications   Medication Sig Start Date End Date Taking? Authorizing Provider  cyclobenzaprine (FLEXERIL) 5 MG tablet Take 1 tablet (5 mg total) by mouth at bedtime as needed for muscle spasms. 02/28/14  Yes [name], MD  ibuprofen (ADVIL,MOTRIN) 800 MG tablet Take 1 tablet (800 mg total) by  mouth 3 (three) times daily. 03/03/14   Harvie Heck, PA-C  meloxicam (MOBIC) 15 MG tablet Take 1 tablet (15 mg total) by mouth daily. 11/25/14   Melony Overly, MD  traMADol (ULTRAM) 50 MG tablet Take 1 tablet (50 mg total) by mouth every 6 (six) hours as needed. 11/25/14   Melony Overly, MD   Meds Ordered and Administered this Visit  Medications - No data to display  BP 121/53 mmHg  Pulse 71  Temp(Src) 98.3 F (36.8 C) (Oral)  Resp 18  SpO2 97%  LMP 11/23/2014 (Exact Date) No data found.   Physical Exam  Constitutional: She is oriented to person, place, and time. She appears well-developed and well-nourished. No distress.  Cardiovascular: Normal rate.   Pulmonary/Chest: Effort normal.  Musculoskeletal:  Right shoulder: Mild swelling at Christina before meals joint area. She is tender to palpation over Christina distal clavicle and before meals joint. She is also tender along Christina trapezius muscle. She has full active range of motion, but pain with abduction beyond 90. Positive empty can test. She is unable to put her right hand on her left shoulder due to pain. 2+ radial pulse. Right elbow: Mild swelling to Christina posterior elbow over Christina olecranon. She has full active range of motion with mild discomfort. She is tender over Christina olecranon.  Neurological: She is alert and oriented to person, place, and time.  ED Course  Procedures (including critical care time)  Labs Review Labs Reviewed - No data to display  Imaging Review Dg Shoulder Right  11/25/2014   CLINICAL DATA:  Acute right shoulder pain after fall at home. Initial encounter.  EXAM: RIGHT SHOULDER - 2+ VIEW  COMPARISON:  None.  FINDINGS: There is no evidence of fracture or dislocation. There is no evidence of arthropathy or other focal bone abnormality. Soft tissues are unremarkable.  IMPRESSION: Normal right shoulder.   Electronically Signed   By: Marijo Conception, M.D.   On: 11/25/2014 14:45   Dg Elbow Complete Right  11/25/2014    CLINICAL DATA:  Right shoulder and elbow pain, fell recently  EXAM: RIGHT ELBOW - COMPLETE 3+ VIEW  COMPARISON:  None  FINDINGS: No acute fracture is seen. Alignment is normal. No evidence of right elbow joint effusion is noted. There is some soft tissue prominence over Christina olecranon which may indicate olecranon bruising or hematoma.  IMPRESSION: No acute fracture or effusion. Question olecranon bruising or hematoma.   Electronically Signed   By: Ivar Drape M.D.   On: 11/25/2014 14:47     MDM   1. Olecranon bursitis, right   2. AC joint pain, right    X-rays negative for fracture. Conservative management with ice, relative rest, meloxicam. Tramadol for severe pain. Follow-up with PCP if not improving in 2 weeks.    Melony Overly, MD 11/25/14 [phone]

## 2016-07-19 ENCOUNTER — Encounter (HOSPITAL_COMMUNITY): Payer: Self-pay | Admitting: Emergency Medicine

## 2016-07-19 ENCOUNTER — Emergency Department (HOSPITAL_COMMUNITY)
Admission: EM | Admit: 2016-07-19 | Discharge: 2016-07-19 | Disposition: A | Discharge status: Home / Self Care | Payer: BLUE CROSS/BLUE SHIELD | Attending: Emergency Medicine | Admitting: Emergency Medicine

## 2016-07-19 DIAGNOSIS — Z87891 Personal history of nicotine dependence: Secondary | ICD-10-CM | POA: Insufficient documentation

## 2016-07-19 DIAGNOSIS — M5442 Lumbago with sciatica, left side: Secondary | ICD-10-CM | POA: Insufficient documentation

## 2016-07-19 DIAGNOSIS — M5432 Sciatica, left side: Secondary | ICD-10-CM

## 2016-07-19 MED ORDER — PREDNISONE 20 MG PO TABS
60.0000 mg | ORAL_TABLET | Freq: Once | ORAL | Status: AC
  Administered 2016-07-19: 60 mg via ORAL
  Filled 2016-07-19: qty 3

## 2016-07-19 MED ORDER — CYCLOBENZAPRINE HCL 10 MG PO TABS
5.0000 mg | ORAL_TABLET | Freq: Once | ORAL | Status: AC
  Administered 2016-07-19: 5 mg via ORAL
  Filled 2016-07-19: qty 1

## 2016-07-19 MED ORDER — CYCLOBENZAPRINE HCL 5 MG PO TABS: 5.0000 mg | ORAL_TABLET | Freq: Three times a day (TID) | ORAL | 0 refills | Status: DC | PRN

## 2016-07-19 MED ORDER — HYDROCODONE-ACETAMINOPHEN 5-325 MG PO TABS
2.0000 | ORAL_TABLET | Freq: Once | ORAL | Status: AC
  Administered 2016-07-19: 2 via ORAL
  Filled 2016-07-19: qty 2

## 2016-07-19 MED ORDER — HYDROCODONE-ACETAMINOPHEN 5-325 MG PO TABS: 1.0000 | ORAL_TABLET | Freq: Four times a day (QID) | ORAL | 0 refills | Status: DC | PRN

## 2016-07-19 MED ORDER — PREDNISONE 20 MG PO TABS: ORAL_TABLET | ORAL | 0 refills | Status: DC

## 2016-07-19 NOTE — ED Triage Notes (Signed)
Patient complains of lower back pain that radiates into both legs.  Pain started on Sunday, was seen at Urgent Care on Monday and given an injection and rx for naproxen.  Patient states the injection barely reduced pain and naproxen was ineffective.  Patient alert and oriented and in no apparent distress at this time.

## 2016-07-19 NOTE — Discharge Instructions (Signed)
Continue naprosyn for pain.   Take flexeril for muscle spasms.   Take prednisone as prescribed.   Take vicodin for severe pain. DO NOT drive with it.   See spine doctor for follow up   Return to ER if you have worse back pain, numbness, weakness.

## 2016-07-19 NOTE — ED Provider Notes (Signed)
[facility] DEPT Provider Note   CSN: [id number] Arrival date & time: 07/19/16  4098     History   Chief Complaint Chief Complaint  Patient presents with  . Back Pain    HPI Christina Rich is a 36 y.o. female history of back pain here presenting with worsening low back pain. Patient states that for the last 3 days she had worsening bilateral back pain worse on the left. Denies any fall or injury to the back. Went to urgent care 2 days ago and was prescribed naproxen and had an IM injection for pain. States that the naproxen has not been helping and she has more paresthesias down the left leg. Denies any numbness or weakness or trouble urinating or fevers.  The history is provided by the patient.    Past Medical History:  Diagnosis Date  . Abdominal pain   . Chicken pox     Patient Active Problem List   Diagnosis Date Noted  . Low back pain 03/03/2014    Past Surgical History:  Procedure Laterality Date  . CESAREAN SECTION     x2  . CHOLECYSTECTOMY    . INTRAUTERINE DEVICE (IUD) INSERTION      OB History    No data available       Home Medications    Prior to Admission medications   Medication Sig Start Date End Date Taking? Authorizing Provider  cyclobenzaprine (FLEXERIL) 5 MG tablet Take 1 tablet (5 mg total) by mouth at bedtime as needed for muscle spasms. 02/28/14   [name], MD  ibuprofen (ADVIL,MOTRIN) 800 MG tablet Take 1 tablet (800 mg total) by mouth 3 (three) times daily. 03/03/14   Harvie Heck, PA-C  meloxicam (MOBIC) 15 MG tablet Take 1 tablet (15 mg total) by mouth daily. 11/25/14   Melony Overly, MD  traMADol (ULTRAM) 50 MG tablet Take 1 tablet (50 mg total) by mouth every 6 (six) hours as needed. 11/25/14   Melony Overly, MD    Family History Family History  Problem Relation Age of Onset  . Hypertension Mother   . Hypertension Father   . Diabetes Father     Social History Social History  Substance Use Topics  . Smoking  status: Former Smoker    Types: Cigarettes  . Smokeless tobacco: Never Used  . Alcohol use Yes     Comment: occasional     Allergies   Patient has no known allergies.   Review of Systems Review of Systems  Musculoskeletal: Positive for back pain.  All other systems reviewed and are negative.    Physical Exam Updated Vital Signs BP (!) 130/93 (BP Location: Right Arm)   Pulse 63   Temp 98.3 F (36.8 C) (Oral)   Resp 14   LMP  (Within Weeks)   SpO2 100%   Physical Exam  Constitutional: She is oriented to person, place, and time.  Uncomfortable   HENT:  Head: Normocephalic.  Eyes: EOM are normal. Pupils are equal, round, and reactive to light.  Neck: Normal range of motion. Neck supple.  Cardiovascular: Normal rate, regular rhythm and normal heart sounds.   Pulmonary/Chest: Effort normal and breath sounds normal. No respiratory distress. She has no wheezes. She has no rales.  Abdominal: Soft. Bowel sounds are normal. She exhibits no distension. There is no tenderness.  Musculoskeletal:  Mild L paralumbar tenderness, no deformity   Neurological: She is alert and oriented to person, place, and time.  + straight leg raise  l side. No saddle anesthesia. Nl strength and sensation throughout   Skin: Skin is warm.  Psychiatric: She has a normal mood and affect.  Nursing note and vitals reviewed.    ED Treatments / Results  Labs (all labs ordered are listed, but only abnormal results are displayed) Labs Reviewed - No data to display  EKG  EKG Interpretation None       Radiology No results found.  Procedures Procedures (including critical care time)  Medications Ordered in ED Medications  cyclobenzaprine (FLEXERIL) tablet 5 mg (not administered)  HYDROcodone-acetaminophen (NORCO/VICODIN) 5-325 MG per tablet 2 tablet (not administered)  predniSONE (DELTASONE) tablet 60 mg (not administered)     Initial Impression / Assessment and Plan / ED Course  I have  reviewed the triage vital signs and the nursing notes.  Pertinent labs & imaging results that were available during my care of the patient were reviewed by me and considered in my medical decision making (see chart for details).    Christina Rich is a 36 y.o. female here with L leg paresthesia, back pain. Likely sciatica. No trauma or fall and no deformity on exam. Will try steroids, continue NSAIDs, add flexeril, short course of vicodin. Will have her follow up with Spine.   Final Clinical Impressions(s) / ED Diagnoses   Final diagnoses:  None    New Prescriptions New Prescriptions   No medications on file     Drenda Freeze, MD 07/19/16 [phone]

## 2016-09-04 ENCOUNTER — Other Ambulatory Visit: Payer: Self-pay | Admitting: Physician Assistant

## 2016-09-04 DIAGNOSIS — M5442 Lumbago with sciatica, left side: Secondary | ICD-10-CM

## 2016-09-16 ENCOUNTER — Ambulatory Visit
Admission: RE | Admit: 2016-09-16 | Discharge: 2016-09-16 | Disposition: A | Discharge status: Home / Self Care | Payer: BLUE CROSS/BLUE SHIELD | Source: Ambulatory Visit | Attending: Physician Assistant | Admitting: Physician Assistant

## 2016-09-16 DIAGNOSIS — M5442 Lumbago with sciatica, left side: Secondary | ICD-10-CM

## 2016-09-20 ENCOUNTER — Other Ambulatory Visit: Payer: Self-pay | Admitting: Neurosurgery

## 2016-09-20 DIAGNOSIS — M5126 Other intervertebral disc displacement, lumbar region: Secondary | ICD-10-CM

## 2016-09-29 ENCOUNTER — Ambulatory Visit
Admission: RE | Admit: 2016-09-29 | Discharge: 2016-09-29 | Disposition: A | Discharge status: Home / Self Care | Payer: BLUE CROSS/BLUE SHIELD | Source: Ambulatory Visit | Attending: Neurosurgery | Admitting: Neurosurgery

## 2016-09-29 DIAGNOSIS — M5126 Other intervertebral disc displacement, lumbar region: Secondary | ICD-10-CM

## 2016-09-29 MED ORDER — METHYLPREDNISOLONE ACETATE 40 MG/ML INJ SUSP (RADIOLOG
120.0000 mg | Freq: Once | INTRAMUSCULAR | Status: AC
  Administered 2016-09-29: 120 mg via EPIDURAL

## 2016-09-29 MED ORDER — IOPAMIDOL (ISOVUE-M 200) INJECTION 41%
1.0000 mL | Freq: Once | INTRAMUSCULAR | Status: AC
  Administered 2016-09-29: 1 mL via EPIDURAL

## 2016-09-29 NOTE — Discharge Instructions (Signed)

## 2016-12-04 ENCOUNTER — Encounter (HOSPITAL_BASED_OUTPATIENT_CLINIC_OR_DEPARTMENT_OTHER): Payer: Self-pay | Admitting: *Deleted

## 2016-12-04 DIAGNOSIS — R2242 Localized swelling, mass and lump, left lower limb: Secondary | ICD-10-CM

## 2016-12-04 HISTORY — DX: Localized swelling, mass and lump, left lower limb: R22.42

## 2016-12-05 ENCOUNTER — Ambulatory Visit: Payer: Self-pay | Admitting: General Surgery

## 2016-12-06 ENCOUNTER — Ambulatory Visit: Payer: Self-pay | Admitting: General Surgery

## 2016-12-08 ENCOUNTER — Encounter (HOSPITAL_BASED_OUTPATIENT_CLINIC_OR_DEPARTMENT_OTHER): Payer: Self-pay | Admitting: Anesthesiology

## 2016-12-08 ENCOUNTER — Ambulatory Visit (HOSPITAL_BASED_OUTPATIENT_CLINIC_OR_DEPARTMENT_OTHER)
Admission: RE | Admit: 2016-12-08 | Discharge: 2016-12-08 | Disposition: A | Discharge status: Home / Self Care | Payer: BLUE CROSS/BLUE SHIELD | Source: Ambulatory Visit | Attending: General Surgery | Admitting: General Surgery

## 2016-12-08 ENCOUNTER — Encounter (HOSPITAL_BASED_OUTPATIENT_CLINIC_OR_DEPARTMENT_OTHER)
Admission: RE | Disposition: A | Discharge status: Home / Self Care | Payer: Self-pay | Source: Ambulatory Visit | Attending: General Surgery

## 2016-12-08 ENCOUNTER — Encounter (HOSPITAL_BASED_OUTPATIENT_CLINIC_OR_DEPARTMENT_OTHER): Payer: Self-pay | Admitting: *Deleted

## 2016-12-08 DIAGNOSIS — Z79899 Other long term (current) drug therapy: Secondary | ICD-10-CM | POA: Insufficient documentation

## 2016-12-08 DIAGNOSIS — F419 Anxiety disorder, unspecified: Secondary | ICD-10-CM | POA: Insufficient documentation

## 2016-12-08 DIAGNOSIS — D2122 Benign neoplasm of connective and other soft tissue of left lower limb, including hip: Secondary | ICD-10-CM | POA: Insufficient documentation

## 2016-12-08 HISTORY — PX: MASS EXCISION: SHX2000

## 2016-12-08 HISTORY — DX: Localized swelling, mass and lump, left lower limb: R22.42

## 2016-12-08 SURGERY — MINOR EXCISION OF MASS
Anesthesia: LOCAL | Site: Leg Lower | Laterality: Left

## 2016-12-08 MED ORDER — BUPIVACAINE HCL (PF) 0.5 % IJ SOLN
INTRAMUSCULAR | Status: AC
  Filled 2016-12-08: qty 30

## 2016-12-08 MED ORDER — CHLORHEXIDINE GLUCONATE CLOTH 2 % EX PADS: 6.0000 | MEDICATED_PAD | Freq: Once | CUTANEOUS | Status: DC

## 2016-12-08 MED ORDER — SODIUM BICARBONATE 4 % IV SOLN
INTRAVENOUS | Status: AC
  Filled 2016-12-08: qty 5

## 2016-12-08 MED ORDER — LIDOCAINE-EPINEPHRINE (PF) 1 %-1:200000 IJ SOLN
INTRAMUSCULAR | Status: AC
  Filled 2016-12-08: qty 30

## 2016-12-08 MED ORDER — SODIUM BICARBONATE 4 % IV SOLN
INTRAVENOUS | Status: DC | PRN
  Administered 2016-12-08: 9 mL via SUBCUTANEOUS

## 2016-12-08 SURGICAL SUPPLY — 32 items
APL SKNCLS STERI-STRIP NONHPOA (GAUZE/BANDAGES/DRESSINGS) ×1
BENZOIN TINCTURE PRP APPL 2/3 (GAUZE/BANDAGES/DRESSINGS) ×2 IMPLANT
BLADE SURG 10 STRL SS (BLADE) IMPLANT
BLADE SURG 15 STRL LF DISP TIS (BLADE) ×1 IMPLANT
BLADE SURG 15 STRL SS (BLADE) ×2
CHLORAPREP W/TINT 26ML (MISCELLANEOUS) ×2 IMPLANT
DRSG TEGADERM 4X4.75 (GAUZE/BANDAGES/DRESSINGS) ×1 IMPLANT
ELECT COATED BLADE 2.86 ST (ELECTRODE) ×1 IMPLANT
ELECT REM PT RETURN 9FT ADLT (ELECTROSURGICAL) ×2
ELECTRODE REM PT RTRN 9FT ADLT (ELECTROSURGICAL) IMPLANT
GAUZE SPONGE 4X4 12PLY STRL (GAUZE/BANDAGES/DRESSINGS) ×2 IMPLANT
GAUZE SPONGE 4X4 12PLY STRL LF (GAUZE/BANDAGES/DRESSINGS) ×2 IMPLANT
GLOVE BIO SURGEON STRL SZ7 (GLOVE) ×1 IMPLANT
GLOVE BIOGEL PI IND STRL 8.5 (GLOVE) ×1 IMPLANT
GLOVE BIOGEL PI INDICATOR 8.5 (GLOVE) ×1
GLOVE ECLIPSE 8.0 STRL XLNG CF (GLOVE) ×3 IMPLANT
GLOVE EXAM NITRILE MD LF STRL (GLOVE) ×1 IMPLANT
GOWN STRL REUS W/ TWL LRG LVL3 (GOWN DISPOSABLE) IMPLANT
GOWN STRL REUS W/ TWL XL LVL3 (GOWN DISPOSABLE) IMPLANT
GOWN STRL REUS W/TWL LRG LVL3 (GOWN DISPOSABLE) ×2
GOWN STRL REUS W/TWL XL LVL3 (GOWN DISPOSABLE) ×2
NDL HYPO 25X1 1.5 SAFETY (NEEDLE) ×1 IMPLANT
NEEDLE HYPO 25X1 1.5 SAFETY (NEEDLE) ×2 IMPLANT
PENCIL BUTTON HOLSTER BLD 10FT (ELECTRODE) ×1 IMPLANT
SHEET MEDIUM DRAPE 40X70 STRL (DRAPES) ×2 IMPLANT
SPONGE GAUZE 2X2 8PLY STRL LF (GAUZE/BANDAGES/DRESSINGS) ×1 IMPLANT
STRIP CLOSURE SKIN 1/2X4 (GAUZE/BANDAGES/DRESSINGS) ×2 IMPLANT
SUT MNCRL AB 3-0 PS2 18 (SUTURE) ×1 IMPLANT
SUT MON AB 4-0 PC3 18 (SUTURE) IMPLANT
SUT VICRYL 3-0 CR8 SH (SUTURE) IMPLANT
SYR CONTROL 10ML LL (SYRINGE) ×2 IMPLANT
TOWEL OR NON WOVEN STRL DISP B (DISPOSABLE) ×1 IMPLANT

## 2016-12-08 NOTE — Interval H&P Note (Signed)
History and Physical Interval Note:  12/08/2016 9:58 AM  Christina Rich  has presented today for surgery, with the diagnosis of symptomatic left lower leg mass  The various methods of treatment have been discussed with the patient and family. After consideration of risks, benefits and other options for treatment, the patient has consented to  Procedure(s): MINOR EXCISION LEFT LOWER LEG MASS (Left) as a surgical intervention .  The patient's history has been reviewed, patient examined, no change in status, stable for surgery.  I have reviewed the patient's chart and labs.  Questions were answered to the patient's satisfaction.     Gloriajean Okun Lenna Sciara

## 2016-12-08 NOTE — Op Note (Signed)
Operative Note  Christina Rich female 36 y.o. 12/08/2016  PREOPERATIVE DX:  Painful subcutaneous soft tissue mass left lower leg  POSTOPERATIVE DX:  Same  PROCEDURE:   Excision of 1.5 cm subcutaneous tissue mass left lower leg         Surgeon: Odis Hollingshead   Assistants: None  Anesthesia: Local anesthesia 1% buffered lidocaine mixed with 0.5% Marcaine and sodium bicarbonate  Indications:   This is a 36 year old female who is developed a exquisitely tender and painful subcutaneous mass in the left lower leg. She now presents for removal.    Procedure Detail:  She was seen in the holding area and my initials were put next to the mass in her left lower leg. She was brought to the operating room placed supine on the operating table. The area over on the mass was sterilely prepped and draped and a timeout was performed.  Local anesthetic was infiltrated in the skin directly over the mass and deepened the subcutaneous tissue around it. A longitudinal incision was made through the skin and dermis and superficial subcutaneous tissue. The mass was identified and was adherent to the fascia. It appeared to be white and very firm. Using blunt dissection, it was separated from the fascia and using electrocautery, it was excised. It measured approximately 1.5 cm. He was placed in formalin and sent to pathology.  Bleeding from the wound was controlled with electrocautery. Once hemostasis was adequate, the wound was closed in 2 layers. The subcutaneous tissues were approximated with interrupted 3-0 Monocryl sutures. The skin was closed with a running 3-0 Monocryl subcuticular stitch. Steri-Strips and a sterile dressing were applied.  She tolerated the procedure was done a pair complications and was taken back to the holding room in satisfactory condition.  Estimated Blood Loss:  less than 50 mL         Specimens: Soft tissue mass        Complications:  * No complications entered in OR log  *         Disposition: PACU - hemodynamically stable.         Condition: stable

## 2016-12-08 NOTE — H&P (Signed)
Christina Rich 11/22/2016 10:41 AM Location: [facility] Patient #: [id number] DOB: 07-Jul-1980 Married / Language: English / Race: Black or African American Female   History of Present Illness Odis Hollingshead MD; 11/22/2016 11:10 AM) The patient is a 36 year old female.  Note:She is referred by Dr. Osborne Casco for consultation regarding a symptomatic left lower leg mass. She says the mass is been there for a number of years but has become increasingly painful. She states she gets pain in the left lower leg mass also feels some chest discomfort. She is unaware of any direct trauma to the area.  Past Surgical History Malachy Moan, [place]; 11/22/2016 10:41 AM) Cesarean Section - Multiple  Gallbladder Surgery - Laparoscopic  Oral Surgery   Diagnostic Studies History Malachy Moan, [place]; 11/22/2016 10:41 AM) Colonoscopy  never Mammogram  never Pap Smear  1-5 years ago  Allergies Malachy Moan, RMA; 11/22/2016 10:42 AM) No Known Allergies 11/22/2016  Medication History Malachy Moan, RMA; 11/22/2016 10:45 AM) Flexeril (10MG  Tablet, Oral) Active. Diclofenac Sodium (50MG  Tablet DR, Oral) Active. Medications Reconciled  Social History Malachy Moan, [place]; 11/22/2016 10:41 AM) Alcohol use  Occasional alcohol use. Caffeine use  Carbonated beverages, Tea. No drug use  Tobacco use  Never smoker.  Family History Malachy Moan, [place]; 11/22/2016 10:41 AM) Hypertension  Father, Mother.  Pregnancy / Birth History Malachy Moan, [place]; 11/22/2016 10:41 AM) Age at menarche  76 years. Gravida  3 Irregular periods  Length (months) of breastfeeding  3-6 Maternal age  23-25  Other Problems Malachy Moan, [place]; 11/22/2016 10:41 AM) Anxiety Disorder  Back Pain  Chest pain     Review of Systems Malachy Moan RMA; 11/22/2016 10:41 AM) General Not Present- Appetite Loss, Chills, Fatigue, Fever, Night Sweats, Weight Gain and Weight  Loss. Skin Not Present- Change in Wart/Mole, Dryness, Hives, Jaundice, New Lesions, Non-Healing Wounds, Rash and Ulcer. HEENT Present- Wears glasses/contact lenses. Not Present- Earache, Hearing Loss, Hoarseness, Nose Bleed, Oral Ulcers, Ringing in the Ears, Seasonal Allergies, Sinus Pain, Sore Throat, Visual Disturbances and Yellow Eyes. Respiratory Not Present- Bloody sputum, Chronic Cough, Difficulty Breathing, Snoring and Wheezing. Breast Not Present- Breast Mass, Breast Pain, Nipple Discharge and Skin Changes. Cardiovascular Not Present- Chest Pain, Difficulty Breathing Lying Down, Leg Cramps, Palpitations, Rapid Heart Rate, Shortness of Breath and Swelling of Extremities. Gastrointestinal Not Present- Abdominal Pain, Bloating, Bloody Stool, Change in Bowel Habits, Chronic diarrhea, Constipation, Difficulty Swallowing, Excessive gas, Gets full quickly at meals, Hemorrhoids, Indigestion, Nausea, Rectal Pain and Vomiting. Female Genitourinary Not Present- Frequency, Nocturia, Painful Urination, Pelvic Pain and Urgency. Musculoskeletal Present- Back Pain. Not Present- Joint Pain, Joint Stiffness, Muscle Pain, Muscle Weakness and Swelling of Extremities. Neurological Not Present- Decreased Memory, Fainting, Headaches, Numbness, Seizures, Tingling, Tremor, Trouble walking and Weakness. Psychiatric Not Present- Anxiety, Bipolar, Change in Sleep Pattern, Depression, Fearful and Frequent crying. Endocrine Not Present- Cold Intolerance, Excessive Hunger, Hair Changes, Heat Intolerance, Hot flashes and New Diabetes. Hematology Not Present- Blood Thinners, Easy Bruising, Excessive bleeding, Gland problems, HIV and Persistent Infections.  Vitals Malachy Moan RMA; 11/22/2016 10:46 AM) 11/22/2016 10:45 AM Weight: 183.2 lb Height: 64in Body Surface Area: 1.88 m Body Mass Index: 31.45 kg/m  Temp.: 74F  Pulse: 61 (Regular)  BP: 134/94 (Sitting, Left Arm, Standard)       Physical Exam  Odis Hollingshead MD; 11/22/2016 11:12 AM) The physical exam findings are as follows: Note:GENERAL APPEARANCE: Obese female in NAD. Pleasant and cooperative.  EARS, NOSE, MOUTH THROAT: [place]/AT external  ears: no lesions or deformities external nose: no lesions or deformities hearing: grossly normal lips: moist, no deformities EYES external: conjunctiva, lids, sclerae normal pupils: equal, round glasses: no  SKIN  rash: none subcutaneous nodules: 1.5 cm x 1.5 cm left lower leg subcutaneous mass was relatively fixed and very tender. No erythema over it.  NEUROLOGIC speech: normal  PSYCHIATRIC alertness and orientation: normal mood/affect/behavior: normal judgement and insight: normal    Assessment & Plan Odis Hollingshead MD; 11/22/2016 11:12 AM) MASS OF SOFT TISSUE OF LEFT LOWER EXTREMITY (R22.42) Impression: This is becoming increasingly symptomatic and is tender to the touch. It measures about 1.5 cm and is somewhat fixed.  Plan: I recommended removal under local anesthesia. The procedure and risks- including but not limited to bleeding, infection, wound healing problems, anesthesia, recurrence-have been discussed. She seems to understand and would like to proceed.  Jackolyn Confer, M.D.

## 2016-12-08 NOTE — Discharge Instructions (Addendum)
No heavy activities or strenuous activities with legs for 1 week.  Apply ice to the area today and tomorrow.  Use Tylenol and Ibuprofen for pain.  May shower.  Remove bandage in 3 days and leave Steri-Strips on. They will fall off by themselves.  Call for heavy bleeding or other wound problems.  Do not get wound submerged in water for at least 1 month.

## 2016-12-11 ENCOUNTER — Encounter (HOSPITAL_BASED_OUTPATIENT_CLINIC_OR_DEPARTMENT_OTHER): Payer: Self-pay | Admitting: General Surgery

## 2017-03-25 ENCOUNTER — Emergency Department (HOSPITAL_COMMUNITY)
Admission: EM | Admit: 2017-03-25 | Discharge: 2017-03-25 | Discharge status: Absent without leave | Payer: BLUE CROSS/BLUE SHIELD

## 2017-03-25 ENCOUNTER — Other Ambulatory Visit: Payer: Self-pay

## 2017-03-25 NOTE — ED Notes (Signed)
Pt stated it hurts to much to sit and will goto urgent care. It was explained to her urgent care does not open until noon. She stated she would rather wait there.

## 2017-03-25 NOTE — ED Notes (Signed)
No answer from pt waiting room 

## 2017-12-26 ENCOUNTER — Encounter (HOSPITAL_COMMUNITY): Payer: Self-pay

## 2017-12-26 ENCOUNTER — Other Ambulatory Visit: Payer: Self-pay

## 2017-12-26 ENCOUNTER — Ambulatory Visit (HOSPITAL_COMMUNITY)
Admission: EM | Admit: 2017-12-26 | Discharge: 2017-12-26 | Disposition: A | Discharge status: Home / Self Care | Payer: BLUE CROSS/BLUE SHIELD | Attending: Family Medicine | Admitting: Family Medicine

## 2017-12-26 DIAGNOSIS — B349 Viral infection, unspecified: Secondary | ICD-10-CM

## 2017-12-26 DIAGNOSIS — Z8249 Family history of ischemic heart disease and other diseases of the circulatory system: Secondary | ICD-10-CM | POA: Diagnosis not present

## 2017-12-26 DIAGNOSIS — J029 Acute pharyngitis, unspecified: Secondary | ICD-10-CM | POA: Diagnosis not present

## 2017-12-26 DIAGNOSIS — B9789 Other viral agents as the cause of diseases classified elsewhere: Secondary | ICD-10-CM

## 2017-12-26 DIAGNOSIS — Z87891 Personal history of nicotine dependence: Secondary | ICD-10-CM | POA: Insufficient documentation

## 2017-12-26 DIAGNOSIS — R591 Generalized enlarged lymph nodes: Secondary | ICD-10-CM | POA: Insufficient documentation

## 2017-12-26 DIAGNOSIS — R590 Localized enlarged lymph nodes: Secondary | ICD-10-CM

## 2017-12-26 LAB — POCT RAPID STREP A: STREPTOCOCCUS, GROUP A SCREEN (DIRECT): NEGATIVE

## 2017-12-26 MED ORDER — CETIRIZINE HCL 10 MG PO CAPS: 10.0000 mg | ORAL_CAPSULE | Freq: Every day | ORAL | 0 refills | Status: AC

## 2017-12-26 NOTE — ED Triage Notes (Signed)
Pt c/o sore throat 2 x days.

## 2017-12-26 NOTE — Discharge Instructions (Addendum)
Strep test negative, will send out for culture and we will call you with results Get plenty of rest and push fluids Zyrtec prescribed.  Take daily for symptomatic relief Continue OTC medications like ibuprofen and/or tylenol as needed  Follow up with PCP if symptoms persists Return or go to ER if patient has any new or worsening symptoms

## 2017-12-26 NOTE — ED Provider Notes (Signed)
[facility]   [id number] 12/26/17 Arrival Time: 1509  [place]:[facility] THROAT  SUBJECTIVE: History from: patient.  Christina Rich is a 37 y.o. female who presents with abrupt onset of sore throat x2 days.  Denies positive sick exposure or precipitating event.  Has NOT tried OTC medications.  Symptoms are made worse with swallowing, but tolerating liquids and own secretions without difficulty.  Reports previous symptoms in the past. Complains of LAD. Denies fever, chills, fatigue, sinus pain, rhinorrhea, cough, SOB, wheezing, chest pain, nausea, rash, changes in bowel or bladder habits.    Received flu shot this year: no.  ROS: As per HPI.  Past Medical History:  Diagnosis Date  . Lower leg mass, left 12/2016   Past Surgical History:  Procedure Laterality Date  . CESAREAN SECTION  09/28/2009  . CESAREAN SECTION    . CHOLECYSTECTOMY  02/03/2003  . MASS EXCISION Left 12/08/2016   Procedure: MINOR EXCISION LEFT LOWER LEG MASS (SOFT TISSUE);  Surgeon: Jackolyn Confer, MD;  Location: [facility];  Service: General;  Laterality: Left;   No Known Allergies No current facility-administered medications on file prior to encounter.    Current Outpatient Medications on File Prior to Encounter  Medication Sig Dispense Refill  . [DISCONTINUED] levonorgestrel (MIRENA) 20 MCG/24HR IUD 1 each by Intrauterine route once.     Social History   Socioeconomic History  . Marital status: Married    Spouse name: Not on file  . Number of children: 2  . Years of education: 105  . Highest education level: Not on file  Occupational History  . Occupation: Corporate treasurer  Social Needs  . Financial resource strain: Not on file  . Food insecurity:    Worry: Not on file    Inability: Not on file  . Transportation needs:    Medical: Not on file    Non-medical: Not on file  Tobacco Use  . Smoking status: Former Smoker    Types: Cigarettes  . Smokeless tobacco:  Never Used  Substance and Sexual Activity  . Alcohol use: Yes    Comment: occasional  . Drug use: No  . Sexual activity: Yes    Birth control/protection: Injection  Lifestyle  . Physical activity:    Days per week: Not on file    Minutes per session: Not on file  . Stress: Not on file  Relationships  . Social connections:    Talks on phone: Not on file    Gets together: Not on file    Attends religious service: Not on file    Active member of club or organization: Not on file    Attends meetings of clubs or organizations: Not on file    Relationship status: Not on file  . Intimate partner violence:    Fear of current or ex partner: Not on file    Emotionally abused: Not on file    Physically abused: Not on file    Forced sexual activity: Not on file  Other Topics Concern  . Not on file  Social History Narrative   Born and raised in [place], [place]. Currently reside in a house with my husband and 2 children. 1 dog. Fun: Relax.   Denies religious beliefs that would effect health care.    Family History  Problem Relation Age of Onset  . Hypertension Mother   . Hypertension Father   . Diabetes Father     OBJECTIVE:  Vitals:   12/26/17 1542 12/26/17 1544  BP:  Marland Kitchen)  149/89  Pulse:  100  Resp:  18  Temp:  97.8 F (36.6 C)  TempSrc:  Oral  SpO2:  100%  Weight: 180 lb (81.6 kg)      General appearance: alert; nontoxic HEENT: NCAT; Ears: EACs clear, TMs pearly gray; Eyes: PERRL.  EOM grossly intact.  Sinuses nontender; Nose: no rhinorrhea; tonsils nonerythematous or enlarged, uvula midline  Neck: Submandibular and anterior cervical LAD Lungs: CTA bilaterally without adventitious breath sounds Heart: regular rate and rhythm.  Radial pulses 2+ symmetrical bilaterally Skin: warm and dry Psychological: alert and cooperative; normal mood and affect  Labs: Results for orders placed or performed during the hospital encounter of 12/26/17 (from the past 24 hour(s))  POCT rapid  strep A [facility])     Status: None   Collection Time: 12/26/17  4:14 PM  Result Value Ref Range   Streptococcus, Group A Screen (Direct) NEGATIVE NEGATIVE     ASSESSMENT & PLAN:  1. Sore throat   2. Viral illness   3. Lymphadenopathy     Meds ordered this encounter  Medications  . Cetirizine HCl 10 MG CAPS    Sig: Take 1 capsule (10 mg total) by mouth daily.    Dispense:  20 capsule    Refill:  0    Order Specific Question:   Supervising Provider    Answer:   Wynona Luna [[id number]]    Strep test negative, will send out for culture and we will call you with results Get plenty of rest and push fluids Zyrtec prescribed.  Take daily for symptomatic relief Continue OTC medications like ibuprofen and/or tylenol as needed  Follow up with PCP if symptoms persists Return or go to ER if patient has any new or worsening symptoms   Reviewed expectations re: course of current medical issues. Questions answered. Outlined signs and symptoms indicating need for more acute intervention. Patient verbalized understanding. After Visit Summary given.        Lestine Box, PA-C 12/26/17 1639

## 2017-12-29 LAB — CULTURE, GROUP A STREP (THRC)

## 2019-01-20 ENCOUNTER — Other Ambulatory Visit: Payer: Self-pay | Admitting: *Deleted

## 2019-01-20 DIAGNOSIS — Z20822 Contact with and (suspected) exposure to covid-19: Secondary | ICD-10-CM

## 2019-01-22 LAB — NOVEL CORONAVIRUS, NAA: SARS-CoV-2, NAA: NOT DETECTED

## 2020-02-29 ENCOUNTER — Emergency Department (HOSPITAL_COMMUNITY)
Admission: EM | Admit: 2020-02-29 | Discharge: 2020-02-29 | Disposition: A | Discharge status: Home / Self Care | Payer: No Typology Code available for payment source | Attending: Emergency Medicine | Admitting: Emergency Medicine

## 2020-02-29 ENCOUNTER — Encounter (HOSPITAL_COMMUNITY): Payer: Self-pay

## 2020-02-29 ENCOUNTER — Other Ambulatory Visit: Payer: Self-pay

## 2020-02-29 ENCOUNTER — Emergency Department (HOSPITAL_COMMUNITY): Payer: No Typology Code available for payment source

## 2020-02-29 DIAGNOSIS — Z87891 Personal history of nicotine dependence: Secondary | ICD-10-CM | POA: Insufficient documentation

## 2020-02-29 DIAGNOSIS — R197 Diarrhea, unspecified: Secondary | ICD-10-CM

## 2020-02-29 DIAGNOSIS — U071 COVID-19: Secondary | ICD-10-CM | POA: Diagnosis not present

## 2020-02-29 DIAGNOSIS — M791 Myalgia, unspecified site: Secondary | ICD-10-CM | POA: Diagnosis present

## 2020-02-29 LAB — URINALYSIS, ROUTINE W REFLEX MICROSCOPIC
Bacteria, UA: NONE SEEN
Bilirubin Urine: NEGATIVE
Glucose, UA: NEGATIVE mg/dL
Ketones, ur: 80 mg/dL — AB
Leukocytes,Ua: NEGATIVE
Nitrite: NEGATIVE
Protein, ur: 30 mg/dL — AB
Specific Gravity, Urine: 1.035 — ABNORMAL HIGH (ref 1.005–1.030)
pH: 6 (ref 5.0–8.0)

## 2020-02-29 LAB — HEPATIC FUNCTION PANEL
ALT: 33 U/L (ref 0–44)
AST: 37 U/L (ref 15–41)
Albumin: 3.7 g/dL (ref 3.5–5.0)
Alkaline Phosphatase: 41 U/L (ref 38–126)
Bilirubin, Direct: 0.1 mg/dL (ref 0.0–0.2)
Indirect Bilirubin: 0.6 mg/dL (ref 0.3–0.9)
Total Bilirubin: 0.7 mg/dL (ref 0.3–1.2)
Total Protein: 7.5 g/dL (ref 6.5–8.1)

## 2020-02-29 LAB — CBC
HCT: 39.9 % (ref 36.0–46.0)
Hemoglobin: 13.9 g/dL (ref 12.0–15.0)
MCH: 29.8 pg (ref 26.0–34.0)
MCHC: 34.8 g/dL (ref 30.0–36.0)
MCV: 85.6 fL (ref 80.0–100.0)
Platelets: 239 10*3/uL (ref 150–400)
RBC: 4.66 MIL/uL (ref 3.87–5.11)
RDW: 12.8 % (ref 11.5–15.5)
WBC: 6.3 10*3/uL (ref 4.0–10.5)
nRBC: 0 % (ref 0.0–0.2)

## 2020-02-29 LAB — I-STAT BETA HCG BLOOD, ED (MC, WL, AP ONLY): I-stat hCG, quantitative: <5 m[IU]/mL (ref <5)

## 2020-02-29 LAB — BASIC METABOLIC PANEL
Anion gap: 13 (ref 5–15)
BUN: 8 mg/dL (ref 6–20)
CO2: 18 mmol/L — ABNORMAL LOW (ref 22–32)
Calcium: 8.7 mg/dL — ABNORMAL LOW (ref 8.9–10.3)
Chloride: 102 mmol/L (ref 98–111)
Creatinine, Ser: 0.77 mg/dL (ref 0.44–1.00)
GFR, Estimated: >60 mL/min (ref >60)
Glucose, Bld: 107 mg/dL — ABNORMAL HIGH (ref 70–99)
Potassium: 3.2 mmol/L — ABNORMAL LOW (ref 3.5–5.1)
Sodium: 133 mmol/L — ABNORMAL LOW (ref 135–145)

## 2020-02-29 LAB — RESP PANEL BY RT-PCR (FLU A&B, COVID) ARPGX2
Influenza A by PCR: NEGATIVE
Influenza B by PCR: NEGATIVE
SARS Coronavirus 2 by RT PCR: POSITIVE — AB

## 2020-02-29 LAB — TROPONIN I (HIGH SENSITIVITY): Troponin I (High Sensitivity): 11 ng/L (ref <18)

## 2020-02-29 LAB — LIPASE, BLOOD: Lipase: 40 U/L (ref 11–51)

## 2020-02-29 MED ORDER — SODIUM CHLORIDE 0.9 % IV BOLUS
1000.0000 mL | Freq: Once | INTRAVENOUS | Status: AC
  Administered 2020-02-29: 08:00:00 1000 mL via INTRAVENOUS

## 2020-02-29 MED ORDER — ONDANSETRON HCL 4 MG/2ML IJ SOLN
4.0000 mg | Freq: Once | INTRAMUSCULAR | Status: AC
  Administered 2020-02-29: 08:00:00 4 mg via INTRAVENOUS
  Filled 2020-02-29: qty 2

## 2020-02-29 MED ORDER — ONDANSETRON HCL 4 MG PO TABS: 4.0000 mg | ORAL_TABLET | Freq: Three times a day (TID) | ORAL | 0 refills | Status: DC | PRN

## 2020-02-29 MED ORDER — ACETAMINOPHEN 325 MG PO TABS
650.0000 mg | ORAL_TABLET | Freq: Once | ORAL | Status: AC
  Administered 2020-02-29: 07:00:00 650 mg via ORAL
  Filled 2020-02-29: qty 2

## 2020-02-29 NOTE — ED Provider Notes (Signed)
[facility] DEPT Provider Note   CSN: [id number] Arrival date & time: 02/29/20  0865     History Chief Complaint  Patient presents with  . Generalized Body Aches    Christina Rich is a 39 y.o. female.  The history is provided by the patient.  Fever Temp source:  Subjective Onset quality:  Gradual Duration:  2 days Timing:  Constant Progression:  Unchanged Chronicity:  New Relieved by:  Nothing Worsened by:  Nothing Associated symptoms: chills, cough, diarrhea, myalgias and vomiting   Associated symptoms: no chest pain, no dysuria, no ear pain, no rash and no sore throat   Risk factors: no sick contacts        Past Medical History:  Diagnosis Date  . Lower leg mass, left 12/2016    Patient Active Problem List   Diagnosis Date Noted  . Low back pain 03/03/2014    Past Surgical History:  Procedure Laterality Date  . CESAREAN SECTION  09/28/2009  . CESAREAN SECTION    . CHOLECYSTECTOMY  02/03/2003  . MASS EXCISION Left 12/08/2016   Procedure: MINOR EXCISION LEFT LOWER LEG MASS (SOFT TISSUE);  Surgeon: Jackolyn Confer, MD;  Location: [facility];  Service: General;  Laterality: Left;     OB History   No obstetric history on file.     Family History  Problem Relation Age of Onset  . Hypertension Mother   . Hypertension Father   . Diabetes Father     Social History   Tobacco Use  . Smoking status: Former Smoker    Types: Cigarettes  . Smokeless tobacco: Never Used  Substance Use Topics  . Alcohol use: Yes    Comment: occasional  . Drug use: No    Home Medications Prior to Admission medications   Medication Sig Start Date End Date Taking? Authorizing Provider  etonogestrel-ethinyl estradiol (NUVARING) 0.12-0.015 MG/24HR vaginal ring Place 1 each vaginally every 30 (thirty) days. 11/04/19  Yes [provider]  Cetirizine HCl 10 MG CAPS Take 1 capsule (10 mg total) by mouth daily. Patient  not taking: Reported on 02/29/2020 12/26/17   Lestine Box, PA-C    Allergies    Patient has no known allergies.  Review of Systems   Review of Systems  Constitutional: Positive for chills and fever.  HENT: Negative for ear pain and sore throat.   Eyes: Negative for pain and visual disturbance.  Respiratory: Positive for cough. Negative for shortness of breath.   Cardiovascular: Negative for chest pain and palpitations.  Gastrointestinal: Positive for diarrhea and vomiting. Negative for abdominal pain.  Genitourinary: Negative for dysuria and hematuria.  Musculoskeletal: Positive for myalgias. Negative for arthralgias and back pain.  Skin: Negative for color change and rash.  Neurological: Negative for seizures and syncope.  All other systems reviewed and are negative.   Physical Exam Updated Vital Signs  ED Triage Vitals  Enc Vitals Group     BP 02/29/20 0600 (!) 179/117     Pulse Rate 02/29/20 0600 87     Resp 02/29/20 0600 20     Temp 02/29/20 0600 99.5 F (37.5 C)     Temp Source 02/29/20 0600 Oral     SpO2 02/29/20 0600 100 %     Weight 02/29/20 0600 180 lb (81.6 kg)     Height 02/29/20 0600 5\' 4"  (1.626 m)     Head Circumference --      Peak Flow --      Pain  Score 02/29/20 0558 10     Pain Loc --      Pain Edu? --      Excl. in GC? --     Physical Exam Vitals and nursing note reviewed.  Constitutional:      General: She is not in acute distress.    Appearance: She is well-developed and well-nourished. She is not ill-appearing.  HENT:     Head: Normocephalic and atraumatic.     Nose: Nose normal.     Mouth/Throat:     Mouth: Mucous membranes are dry.  Eyes:     Extraocular Movements: Extraocular movements intact.     Conjunctiva/sclera: Conjunctivae normal.     Pupils: Pupils are equal, round, and reactive to light.  Cardiovascular:     Rate and Rhythm: Normal rate and regular rhythm.     Pulses: Normal pulses.     Heart sounds: Normal heart  sounds. No murmur heard.   Pulmonary:     Effort: Pulmonary effort is normal. No respiratory distress.     Breath sounds: Normal breath sounds.  Abdominal:     Palpations: Abdomen is soft.     Tenderness: There is no abdominal tenderness.  Musculoskeletal:        General: No edema.     Cervical back: Normal range of motion and neck supple.  Skin:    General: Skin is warm and dry.     Capillary Refill: Capillary refill takes less than 2 seconds.  Neurological:     General: No focal deficit present.     Mental Status: She is alert.  Psychiatric:        Mood and Affect: Mood and affect and mood normal.     ED Results / Procedures / Treatments   Labs (all labs ordered are listed, but only abnormal results are displayed) Labs Reviewed  RESP PANEL BY RT-PCR (FLU A&B, COVID) ARPGX2 - Abnormal; Notable for the following components:      Result Value   SARS Coronavirus 2 by RT PCR POSITIVE (*)    All other components within normal limits  BASIC METABOLIC PANEL - Abnormal; Notable for the following components:   Sodium 133 (*)    Potassium 3.2 (*)    CO2 18 (*)    Glucose, Bld 107 (*)    Calcium 8.7 (*)    All other components within normal limits  URINALYSIS, ROUTINE W REFLEX MICROSCOPIC - Abnormal; Notable for the following components:   APPearance HAZY (*)    Specific Gravity, Urine 1.035 (*)    Hgb urine dipstick SMALL (*)    Ketones, ur 80 (*)    Protein, ur 30 (*)    All other components within normal limits  CBC  HEPATIC FUNCTION PANEL  LIPASE, BLOOD  I-STAT BETA HCG BLOOD, ED ([facility], [facility], [facility] ONLY)  TROPONIN I (HIGH SENSITIVITY)    EKG EKG Interpretation  Date/Time:  Sunday February 29 2020 06:08:51 EST Ventricular Rate:  76 PR Interval:    QRS Duration: 77 QT Interval:  373 QTC Calculation: 420 R Axis:   51 Text Interpretation: Sinus rhythm Biatrial enlargement Rate is faster Confirmed by Paula LibraMolpus, John ([id number]) on 02/29/2020 6:14:41 AM   Radiology DG Chest  Portable 1 View  Result Date: 02/29/2020 CLINICAL DATA:  Pt reported generalized body aches, nonproductive cough, chest pain, and nausea that started yesterday. EXAM: PORTABLE CHEST - 1 VIEW COMPARISON:  08/27/2011 FINDINGS: Lungs are clear. Heart size and mediastinal contours are within normal  limits. No effusion.  No pneumothorax. Visualized bones unremarkable. IMPRESSION: No acute cardiopulmonary disease. Electronically Signed   By: Lucrezia Europe M.D.   On: 02/29/2020 08:18    Procedures Procedures (including critical care time)  Medications Ordered in ED Medications  sodium chloride 0.9 % bolus 1,000 mL (1,000 mLs Intravenous New Bag/Given 02/29/20 0759)  acetaminophen (TYLENOL) tablet 650 mg (650 mg Oral Given 02/29/20 0728)  ondansetron (ZOFRAN) injection 4 mg (4 mg Intravenous Given 02/29/20 0805)    ED Course  I have reviewed the triage vital signs and the nursing notes.  Pertinent labs & imaging results that were available during my care of the patient were reviewed by me and considered in my medical decision making (see chart for details).    MDM Rules/Calculators/A&P                          Christina Rich is here with fever, body aches, diarrhea, cough.  Overall unremarkable vitals.  Patient has ready been in the waiting room with labs and Covid testing already performed.  Patient is positive for Covid.  No signs of respiratory distress.  Normal room air and ambulatory oxygenation.  Mostly having GI complaints with nausea, vomiting, diarrhea.  Suspect secondary to Covid.  Denies any urinary symptoms.  Will get a chest x-ray and urinalysis and perform symptomatic treatment with IV fluids and IV Zofran and Tylenol.  Will reevaluate but anticipate discharge to home.  Patient feeling better after IV fluids and IV Zofran.  Lab work was otherwise unremarkable.  Chest x-ray with no infection findings.  No pneumothorax.  No inflammation.  Urinalysis negative for infection.  Will  prescribe Zofran.  Educated about Covid.  This chart was dictated using voice recognition software.  Despite best efforts to proofread,  errors can occur which can change the documentation meaning.  Christina Rich was evaluated in Emergency Department on 02/29/2020 for the symptoms described in the history of present illness. She was evaluated in the context of the global COVID-19 pandemic, which necessitated consideration that the patient might be at risk for infection with the SARS-CoV-2 virus that causes COVID-19. Institutional protocols and algorithms that pertain to the evaluation of patients at risk for COVID-19 are in a state of rapid change based on information released by regulatory bodies including the CDC and federal and state organizations. These policies and algorithms were followed during the patient's care in the ED.  Final Clinical Impression(s) / ED Diagnoses Final diagnoses:  COVID-19  Diarrhea, unspecified type    Rx / DC Orders ED Discharge Orders    None       Lennice Sites, DO 02/29/20 [phone]

## 2020-02-29 NOTE — ED Triage Notes (Signed)
Patient arrived via [facility] with complaints of generalized body aches, nonproductive cough and nausea that started yesterday.

## 2020-02-29 NOTE — ED Notes (Signed)
After triage patient began complaining of central chest pain that started at 2pm yesterday that radiates to her back.

## 2020-03-01 ENCOUNTER — Telehealth: Payer: Self-pay | Admitting: Nurse Practitioner

## 2020-03-01 ENCOUNTER — Telehealth (HOSPITAL_COMMUNITY): Payer: Self-pay | Admitting: Emergency Medicine

## 2020-03-01 MED ORDER — ONDANSETRON HCL 4 MG PO TABS: 4.0000 mg | ORAL_TABLET | Freq: Three times a day (TID) | ORAL | 0 refills | Status: AC | PRN

## 2020-03-01 MED ORDER — ONDANSETRON HCL 4 MG PO TABS: 4.0000 mg | ORAL_TABLET | Freq: Four times a day (QID) | ORAL | 0 refills | Status: AC

## 2020-03-01 NOTE — Telephone Encounter (Signed)
Rx for zofran sent to pharmacy.

## 2020-03-01 NOTE — Telephone Encounter (Signed)
Called to Discuss with patient about Covid symptoms and the use of the monoclonal antibody infusion for those with mild to moderate Covid symptoms and at a high risk of hospitalization.     Pt appears to qualify for this infusion due to co-morbid conditions and/or a member of an at-risk group in accordance with the FDA Emergency Use Authorization. BMI 30, smoker   Unable to reach pt, Voicemail left and My Chart sent.   Symptom onset: 12/25 Vaccinated: NO  Qualified for Infusion: Requires further discussion with patient.   Willette Alma, NP [facility]  [phone]

## 2021-05-17 ENCOUNTER — Ambulatory Visit: Payer: No Typology Code available for payment source | Admitting: Podiatry

## 2023-11-05 ENCOUNTER — Encounter: Payer: Self-pay | Admitting: Emergency Medicine

## 2023-11-05 ENCOUNTER — Ambulatory Visit
Admission: EM | Admit: 2023-11-05 | Discharge: 2023-11-05 | Disposition: A | Discharge status: Home / Self Care | Attending: Family Medicine | Admitting: Family Medicine

## 2023-11-05 DIAGNOSIS — W44F9XA Other object of natural or organic material, entering into or through a natural orifice, initial encounter: Secondary | ICD-10-CM | POA: Diagnosis not present

## 2023-11-05 DIAGNOSIS — T192XXA Foreign body in vulva and vagina, initial encounter: Secondary | ICD-10-CM | POA: Diagnosis not present

## 2023-11-05 DIAGNOSIS — R1030 Lower abdominal pain, unspecified: Secondary | ICD-10-CM | POA: Diagnosis present

## 2023-11-05 DIAGNOSIS — N898 Other specified noninflammatory disorders of vagina: Secondary | ICD-10-CM | POA: Insufficient documentation

## 2023-11-05 MED ORDER — METRONIDAZOLE 0.75 % VA GEL: 1.0000 | Freq: Two times a day (BID) | VAGINAL | 0 refills | Status: AC

## 2023-11-05 NOTE — ED Triage Notes (Signed)
 Patient states that she has a tampon stuck in her vagina since Thursday.  Patient states she can feel it and touch just can't reach it.  Having some lower abdominal pain.

## 2023-11-05 NOTE — ED Provider Notes (Signed)
 [facility]    CSN: [id number] Arrival date & time: 11/05/23  1515      History   Chief Complaint Chief Complaint  Patient presents with   Foreign Body in Vagina    HPI Christina Rich is a 43 y.o. female.   HPI 43 year old female presents with foreign body in vagina.  Patient reports retained tampon since last Thursday, 11/01/2023.  Patient reports some lower abdominal pain PMH significant for left lower leg mass.  Past Medical History:  Diagnosis Date   Lower leg mass, left 12/2016    Patient Active Problem List   Diagnosis Date Noted   Low back pain 03/03/2014    Past Surgical History:  Procedure Laterality Date   CESAREAN SECTION  09/28/2009   CESAREAN SECTION     CHOLECYSTECTOMY  02/03/2003   MASS EXCISION Left 12/08/2016   Procedure: MINOR EXCISION LEFT LOWER LEG MASS (SOFT TISSUE);  Surgeon: Lily Boas, MD;  Location: [facility];  Service: General;  Laterality: Left;    OB History   No obstetric history on file.      Home Medications    Prior to Admission medications   Medication Sig Start Date End Date Taking? Authorizing Provider  metroNIDAZOLE  (METROGEL ) 0.75 % vaginal gel Place 1 Applicatorful vaginally 2 (two) times daily for 5 days. 11/05/23 11/10/23 Yes Teddy Sharper, FNP  sertraline (ZOLOFT) 25 MG tablet Take 25 mg by mouth. 06/21/23  Yes [provider]  Telmisartan-amLODIPine 40-5 MG TABS Take 1 tablet by mouth daily. 09/21/23  Yes [provider]  amLODipine (NORVASC) 10 MG tablet Take 10 mg by mouth daily.    [provider]  Cetirizine  HCl 10 MG CAPS Take 1 capsule (10 mg total) by mouth daily. Patient not taking: Reported on 02/29/2020 12/26/17   Martell Grenada, PA-C  etonogestrel-ethinyl estradiol (NUVARING) 0.12-0.015 MG/24HR vaginal ring Place 1 each vaginally every 30 (thirty) days. 11/04/19   [provider]  ondansetron  (ZOFRAN ) 4 MG tablet Take 1 tablet (4 mg total) by  mouth every 6 (six) hours. 03/01/20   Griselda Norris, MD  ondansetron  (ZOFRAN ) 4 MG tablet Take 1 tablet (4 mg total) by mouth every 8 (eight) hours as needed for up to 15 doses for nausea or vomiting. 03/01/20   Griselda Norris, MD    Family History Family History  Problem Relation Age of Onset   Hypertension Mother    Hypertension Father    Diabetes Father     Social History Social History   Tobacco Use   Smoking status: Former    Types: Cigarettes   Smokeless tobacco: Never  Vaping Use   Vaping status: Never Used  Substance Use Topics   Alcohol use: Yes    Comment: occasional   Drug use: No     Allergies   Patient has no known allergies.   Review of Systems Review of Systems   Physical Exam Triage Vital Signs ED Triage Vitals  Encounter Vitals Group     BP      Girls Systolic BP Percentile      Girls Diastolic BP Percentile      Boys Systolic BP Percentile      Boys Diastolic BP Percentile      Pulse      Resp      Temp      Temp src      SpO2      Weight      Height  Head Circumference      Peak Flow      Pain Score      Pain Loc      Pain Education      Exclude from Growth Chart    No data found.  Updated Vital Signs BP (!) 181/109 (BP Location: Right Arm)   Pulse 72   Temp 98.7 F (37.1 C) (Oral)   Resp 18   Ht 5' 4 (1.626 m)   Wt 170 lb (77.1 kg)   LMP 10/31/2023   SpO2 99%   BMI 29.18 kg/m   Visual Acuity Right Eye Distance:   Left Eye Distance:   Bilateral Distance:    Right Eye Near:   Left Eye Near:    Bilateral Near:     Physical Exam Vitals and nursing note reviewed.  Constitutional:      Appearance: Normal appearance. She is obese.  HENT:     Head: Normocephalic and atraumatic.     Mouth/Throat:     Mouth: Mucous membranes are moist.     Pharynx: Oropharynx is clear.  Eyes:     Extraocular Movements: Extraocular movements intact.     Pupils: Pupils are equal, round, and reactive to light.   Cardiovascular:     Rate and Rhythm: Normal rate and regular rhythm.     Pulses: Normal pulses.     Heart sounds: Normal heart sounds.  Pulmonary:     Effort: Pulmonary effort is normal.     Breath sounds: Normal breath sounds. No wheezing, rhonchi or rales.  Genitourinary:    Vagina: Vaginal discharge present.     Comments: Pelvic exam performed: Retained tampon removed successfully patient tolerated procedure well with some vaginal pain, moderate vaginal mucopurulent discharge noted Musculoskeletal:        General: Normal range of motion.  Skin:    General: Skin is warm and dry.  Neurological:     General: No focal deficit present.     Mental Status: She is alert and oriented to person, place, and time. Mental status is at baseline.  Psychiatric:        Mood and Affect: Mood normal.        Behavior: Behavior normal.      UC Treatments / Results  Labs (all labs ordered are listed, but only abnormal results are displayed) Labs Reviewed  CERVICOVAGINAL ANCILLARY ONLY    EKG   Radiology No results found.  Procedures Procedures (including critical care time)  Medications Ordered in UC Medications - No data to display  Initial Impression / Assessment and Plan / UC Course  I have reviewed the triage vital signs and the nursing notes.  Pertinent labs & imaging results that were available during my care of the patient were reviewed by me and considered in my medical decision making (see chart for details).     MDM: 1.  Foreign body in vagina, initial encounter-removed successfully on pelvic exam; 2.  Vaginal discharge-evidence on pelvic exam today Rx'd MetroGel  0.75% vaginal gel: Place 1 applicatorful per vagina twice daily x 5 days. Advised patient we will treat for bacterial vaginosis advised patient we will follow-up with Aptima swab results once received.  Advised patient to use MetroGel  applicator as directed.  Encouraged to increase daily water intake to 64 ounces  per day while using this medication.  Advised if symptoms worsen and/or unresolved please follow-up with your GYN or here for further evaluation.  Patient discharged home, hemodynamically stable. Final Clinical Impressions(s) /  UC Diagnoses   Final diagnoses:  Foreign body in vagina, initial encounter  Vaginal discharge     Discharge Instructions      Advised patient we will treat for bacterial vaginosis advised patient we will follow-up with Aptima swab results once received.  Advised patient to use MetroGel  applicator as directed.  Encouraged to increase daily water intake to 64 ounces per day while using this medication.  Advised if symptoms worsen and/or unresolved please follow-up with your GYN or here for further evaluation.     ED Prescriptions     Medication Sig Dispense Auth. Provider   metroNIDAZOLE  (METROGEL ) 0.75 % vaginal gel Place 1 Applicatorful vaginally 2 (two) times daily for 5 days. 100 g Sedalia Greeson, FNP      PDMP not reviewed this encounter.   Teddy Sharper, FNP 11/05/23 1624

## 2023-11-05 NOTE — Discharge Instructions (Addendum)
 Advised patient we will treat for bacterial vaginosis advised patient we will follow-up with [facility]ima swab results once received.  Advised patient to use MetroGel  applicator as directed.  Encouraged to increase daily water intake to 64 ounces per day while using this medication.  Advised if symptoms worsen and/or unresolved please follow-up with your GYN or here for further evaluation.

## 2023-11-07 ENCOUNTER — Ambulatory Visit (HOSPITAL_COMMUNITY): Payer: Self-pay

## 2023-11-07 LAB — CERVICOVAGINAL ANCILLARY ONLY
Bacterial Vaginitis (gardnerella): NEGATIVE
Candida Glabrata: NEGATIVE
Candida Vaginitis: POSITIVE — AB
Comment: NEGATIVE
Comment: NEGATIVE
Comment: NEGATIVE

## 2023-11-07 MED ORDER — FLUCONAZOLE 150 MG PO TABS
150.0000 mg | ORAL_TABLET | Freq: Once | ORAL | 0 refills | Status: AC
Start: 2023-11-07 — End: 2023-11-07

## 2023-11-19 ENCOUNTER — Other Ambulatory Visit: Payer: Self-pay

## 2023-11-19 DIAGNOSIS — M62830 Muscle spasm of back: Secondary | ICD-10-CM

## 2023-11-28 ENCOUNTER — Ambulatory Visit
Admission: RE | Admit: 2023-11-28 | Discharge: 2023-11-28 | Disposition: A | Discharge status: Home / Self Care | Source: Ambulatory Visit

## 2023-11-28 DIAGNOSIS — M62830 Muscle spasm of back: Secondary | ICD-10-CM
